# Patient Record
Sex: Male | Born: 1970 | Race: Black or African American | Hispanic: No | Marital: Single | Smoking: Former smoker
Health system: Southern US, Community
[De-identification: ages and names within clinical notes are randomized; demographics above are authoritative.]

## PROBLEM LIST (undated history)

## (undated) DIAGNOSIS — Q549 Hypospadias, unspecified: Secondary | ICD-10-CM

## (undated) DIAGNOSIS — A048 Other specified bacterial intestinal infections: Secondary | ICD-10-CM

## (undated) DIAGNOSIS — A539 Syphilis, unspecified: Secondary | ICD-10-CM

## (undated) DIAGNOSIS — E669 Obesity, unspecified: Secondary | ICD-10-CM

## (undated) DIAGNOSIS — N390 Urinary tract infection, site not specified: Secondary | ICD-10-CM

## (undated) HISTORY — DX: Other specified bacterial intestinal infections: A04.8

## (undated) HISTORY — PX: HERNIA REPAIR: SHX51

## (undated) HISTORY — DX: Urinary tract infection, site not specified: N39.0

## (undated) HISTORY — DX: Syphilis, unspecified: A53.9

## (undated) HISTORY — DX: Obesity, unspecified: E66.9

## (undated) HISTORY — DX: Hypospadias, unspecified: Q54.9

---

## 1998-01-15 ENCOUNTER — Emergency Department (HOSPITAL_COMMUNITY): Admission: EM | Admit: 1998-01-15 | Discharge: 1998-01-15 | Payer: Self-pay | Admitting: Emergency Medicine

## 1998-01-22 ENCOUNTER — Emergency Department (HOSPITAL_COMMUNITY): Admission: EM | Admit: 1998-01-22 | Discharge: 1998-01-22 | Payer: Self-pay | Admitting: Emergency Medicine

## 2005-03-09 ENCOUNTER — Emergency Department (HOSPITAL_COMMUNITY): Admission: EM | Admit: 2005-03-09 | Discharge: 2005-03-10 | Payer: Self-pay | Admitting: Emergency Medicine

## 2005-12-18 ENCOUNTER — Emergency Department (HOSPITAL_COMMUNITY): Admission: EM | Admit: 2005-12-18 | Discharge: 2005-12-18 | Payer: Self-pay | Admitting: Emergency Medicine

## 2006-01-09 ENCOUNTER — Emergency Department (HOSPITAL_COMMUNITY): Admission: EM | Admit: 2006-01-09 | Discharge: 2006-01-09 | Payer: Self-pay | Admitting: Family Medicine

## 2006-05-31 ENCOUNTER — Emergency Department (HOSPITAL_COMMUNITY): Admission: EM | Admit: 2006-05-31 | Discharge: 2006-05-31 | Payer: Self-pay | Admitting: Emergency Medicine

## 2006-11-24 ENCOUNTER — Emergency Department (HOSPITAL_COMMUNITY): Admission: EM | Admit: 2006-11-24 | Discharge: 2006-11-24 | Payer: Self-pay | Admitting: Emergency Medicine

## 2007-02-24 ENCOUNTER — Emergency Department (HOSPITAL_COMMUNITY): Admission: EM | Admit: 2007-02-24 | Discharge: 2007-02-24 | Payer: Self-pay | Admitting: Family Medicine

## 2007-06-16 ENCOUNTER — Emergency Department (HOSPITAL_COMMUNITY): Admission: EM | Admit: 2007-06-16 | Discharge: 2007-06-16 | Payer: Self-pay | Admitting: Emergency Medicine

## 2008-03-17 ENCOUNTER — Emergency Department (HOSPITAL_COMMUNITY): Admission: EM | Admit: 2008-03-17 | Discharge: 2008-03-17 | Payer: Self-pay | Admitting: Emergency Medicine

## 2010-04-16 ENCOUNTER — Emergency Department (HOSPITAL_COMMUNITY)
Admission: EM | Admit: 2010-04-16 | Discharge: 2010-04-16 | Payer: Self-pay | Source: Home / Self Care | Admitting: Emergency Medicine

## 2010-04-19 ENCOUNTER — Emergency Department (HOSPITAL_COMMUNITY)
Admission: EM | Admit: 2010-04-19 | Discharge: 2010-04-19 | Disposition: A | Discharge status: Still In Hospital | Payer: Self-pay | Source: Home / Self Care | Admitting: Emergency Medicine

## 2010-07-04 LAB — DIFFERENTIAL
Basophils Absolute: 0 10*3/uL (ref 0.0–0.1)
Eosinophils Absolute: 0 10*3/uL (ref 0.0–0.7)
Lymphs Abs: 2.5 10*3/uL (ref 0.7–4.0)
Monocytes Relative: 9 % (ref 3–12)

## 2010-07-04 LAB — URINALYSIS, ROUTINE W REFLEX MICROSCOPIC
Bilirubin Urine: NEGATIVE
Glucose, UA: NEGATIVE mg/dL
Ketones, ur: NEGATIVE mg/dL
Protein, ur: NEGATIVE mg/dL

## 2010-07-04 LAB — URINE MICROSCOPIC-ADD ON

## 2010-07-04 LAB — CBC
Hemoglobin: 14.3 g/dL (ref 13.0–17.0)
MCH: 30 pg (ref 26.0–34.0)
MCV: 86.2 fL (ref 78.0–100.0)
RBC: 4.77 MIL/uL (ref 4.22–5.81)

## 2010-07-04 LAB — COMPREHENSIVE METABOLIC PANEL
AST: 32 U/L (ref 0–37)
BUN: 7 mg/dL (ref 6–23)
CO2: 28 mEq/L (ref 19–32)
Chloride: 103 mEq/L (ref 96–112)
Creatinine, Ser: 0.9 mg/dL (ref 0.4–1.5)
GFR calc Af Amer: >60 mL/min (ref >60)
GFR calc non Af Amer: >60 mL/min (ref >60)
Total Bilirubin: 0.8 mg/dL (ref 0.3–1.2)

## 2011-01-24 LAB — GC/CHLAMYDIA PROBE AMP, GENITAL
Chlamydia, DNA Probe: NEGATIVE
GC Probe Amp, Genital: NEGATIVE

## 2011-01-24 LAB — RPR: RPR Ser Ql: REACTIVE — AB

## 2012-04-25 ENCOUNTER — Encounter (HOSPITAL_COMMUNITY): Payer: Self-pay | Admitting: Emergency Medicine

## 2012-04-25 ENCOUNTER — Other Ambulatory Visit (HOSPITAL_COMMUNITY)
Admission: RE | Admit: 2012-04-25 | Discharge: 2012-04-25 | Disposition: A | Discharge status: Home / Self Care | Payer: Self-pay | Source: Ambulatory Visit | Attending: Emergency Medicine | Admitting: Emergency Medicine

## 2012-04-25 ENCOUNTER — Emergency Department (INDEPENDENT_AMBULATORY_CARE_PROVIDER_SITE_OTHER)
Admission: EM | Admit: 2012-04-25 | Discharge: 2012-04-25 | Disposition: A | Discharge status: Home / Self Care | Payer: Self-pay | Source: Home / Self Care | Attending: Emergency Medicine | Admitting: Emergency Medicine

## 2012-04-25 DIAGNOSIS — Z113 Encounter for screening for infections with a predominantly sexual mode of transmission: Secondary | ICD-10-CM | POA: Insufficient documentation

## 2012-04-25 DIAGNOSIS — K2970 Gastritis, unspecified, without bleeding: Secondary | ICD-10-CM

## 2012-04-25 DIAGNOSIS — N390 Urinary tract infection, site not specified: Secondary | ICD-10-CM

## 2012-04-25 DIAGNOSIS — A048 Other specified bacterial intestinal infections: Secondary | ICD-10-CM

## 2012-04-25 LAB — POCT URINALYSIS DIP (DEVICE)
Protein, ur: NEGATIVE mg/dL
Urobilinogen, UA: 4 mg/dL — ABNORMAL HIGH (ref 0.0–1.0)
pH: 7 (ref 5.0–8.0)

## 2012-04-25 MED ORDER — GI COCKTAIL ~~LOC~~
ORAL | Status: AC
  Filled 2012-04-25: qty 30

## 2012-04-25 MED ORDER — METRONIDAZOLE 500 MG PO TABS: 500.0000 mg | ORAL_TABLET | Freq: Two times a day (BID) | ORAL | Status: DC

## 2012-04-25 MED ORDER — OMEPRAZOLE 20 MG PO CPDR: 20.0000 mg | DELAYED_RELEASE_CAPSULE | Freq: Every day | ORAL | Status: DC

## 2012-04-25 MED ORDER — GI COCKTAIL ~~LOC~~
30.0000 mL | Freq: Once | ORAL | Status: AC
  Administered 2012-04-25: 30 mL via ORAL

## 2012-04-25 MED ORDER — AMOXICILLIN 500 MG PO CAPS: 1000.0000 mg | ORAL_CAPSULE | Freq: Two times a day (BID) | ORAL | Status: DC

## 2012-04-25 NOTE — ED Notes (Signed)
Pt reports burning and stomach pain after eating and some urinary discomfort

## 2012-04-25 NOTE — ED Provider Notes (Signed)
Chief Complaint  Patient presents with  . Abdominal Pain    History of Present Illness:    The patient is a 42 year old male who presents today with a two-day history of burning epigastric pain after he is or drinks anything. He's felt nauseated, doesn't have much of an appetite, thinks he may have lost weight. He denies any vomiting, constipation, diarrhea, or melena. He's had no fever or chills.  He also has a 29 day history of dysuria and frequency. He denies any urethral discharge. He does have a history of hypospadias. He also has a history of urinary tract infections and a history of syphilis. He denies any gonorrhea or chlamydia. He denies any urethral discharge. No lesions on the penis.  Review of Systems:  Other than noted above, the patient denies any of the following symptoms: Constitutional:  No fever, chills, fatigue, weight loss or anorexia. Lungs:  No cough or shortness of breath. Heart:  No chest pain, palpitations, syncope or edema.  No cardiac history. Abdomen:  No nausea, vomiting, hematememesis, melena, diarrhea, or hematochezia. GU:  No dysuria, frequency, urgency, or hematuria.  No testicular pain or swelling.  PMFSH:  Past medical history, family history, social history, meds, and allergies were reviewed along with nurse's notes.  No prior abdominal surgeries or history of GI problems.  No use of NSAIDs or aspirin.  No excessive  alcohol intake.  Physical Exam:   Vital signs:  BP 117/69  Pulse 60  Temp 98.2 F (36.8 C) (Oral)  Resp 16  SpO2 100% Gen:  Alert, oriented, in no distress. Lungs:  Breath sounds clear and equal bilaterally.  No wheezes, rales or rhonchi. Heart:  Regular rhythm.  No gallops or murmers.   Abdomen:  Abdomen is soft, flat, nondistended. There is slight epigastric tenderness to palpation without guarding or rebound. No organomegaly or mass. Bowel sounds are normally active. Murphy sign and Murphy's punch were negative. Genital exam: He has  hypospadias. This has not been repaired. There's no urethral discharge. No lesions on the penis, testes are normal, no hernia, no inguinal lymphadenopathy. Skin:  Clear, warm and dry.  No rash.  Labs:   Results for orders placed during the hospital encounter of 04/25/12  POCT H PYLORI SCREEN      Component Value Range   H. PYLORI SCREEN, POC POSITIVE (*) NEGATIVE  POCT URINALYSIS DIP (DEVICE)      Component Value Range   Glucose, UA NEGATIVE  NEGATIVE mg/dL   Bilirubin Urine NEGATIVE  NEGATIVE   Ketones, ur NEGATIVE  NEGATIVE mg/dL   Specific Gravity, Urine 1.010  1.005 - 1.030   Hgb urine dipstick TRACE (*) NEGATIVE   pH 7.0  5.0 - 8.0   Protein, ur NEGATIVE  NEGATIVE mg/dL   Urobilinogen, UA 4.0 (*) 0.0 - 1.0 mg/dL   Nitrite NEGATIVE  NEGATIVE   Leukocytes, UA TRACE (*) NEGATIVE    Other Labs Obtained at Urgent Care Center:  He a first for a specimen was obtained for DNA probe for gonorrhea, Chlamydia, and Trichomonas. Also obtained a clean voided specimen for urine culture.  Results are pending at this time and we will call about any positive results.  Assessment:  The primary encounter diagnosis was Helicobacter pylori gastritis. A diagnosis of UTI (lower urinary tract infection) was also pertinent to this visit.  I will go ahead and treat H. pylori infection have him followup with a gastroenterologist in 2 weeks. I'm going to await the results of  the urine culture before treating him for UTI, since the medications that he is on may be sufficient.  Plan:   1.  The following meds were prescribed:   New Prescriptions   AMOXICILLIN (AMOXIL) 500 MG CAPSULE    Take 2 capsules (1,000 mg total) by mouth 2 (two) times daily.   METRONIDAZOLE (FLAGYL) 500 MG TABLET    Take 1 tablet (500 mg total) by mouth 2 (two) times daily.   OMEPRAZOLE (PRILOSEC) 20 MG CAPSULE    Take 1 capsule (20 mg total) by mouth daily.   He is also to take Pepto-Bismol 2 tablespoons full 4 times daily with meals  and at bedtime. He should continue the omeprazole for 2 months.  2.  The patient was instructed in symptomatic care and handouts were given. 3.  The patient was told to return if becoming worse in any way, if no better in 3 or 4 days, and given some red flag symptoms that would indicate earlier return.  Follow up:  The patient was told to follow up with Dr. Julio Alm in 2 weeks.      Reuben Likes, MD 04/25/12 2006

## 2012-04-26 ENCOUNTER — Encounter: Payer: Self-pay | Admitting: Internal Medicine

## 2012-04-27 LAB — URINE CULTURE
Colony Count: 30000
Special Requests: NORMAL

## 2012-05-02 ENCOUNTER — Encounter: Payer: Self-pay | Admitting: *Deleted

## 2012-05-02 NOTE — ED Notes (Signed)
Urine culture: 30,000 colonies E. Coli.  Pt. adequately treated with Amoxicillin. Vassie Moselle 05/02/2012

## 2012-05-16 ENCOUNTER — Telehealth (HOSPITAL_COMMUNITY): Payer: Self-pay | Admitting: *Deleted

## 2012-05-16 NOTE — ED Notes (Signed)
Affirm: Trich pos.  Pt. adequately treated with Flagyl. I called cell/home number and contact number and got a fast busy signal for all of them.  I called and left a message at pt.'s work number.

## 2012-05-23 ENCOUNTER — Telehealth (HOSPITAL_COMMUNITY): Payer: Self-pay | Admitting: *Deleted

## 2012-05-23 NOTE — ED Notes (Signed)
I called pt.  Pt. verified x 2 and given results.  Pt. told he was adequately treated for UTI with Amoxicillin and Trich with Flagyl  Instructed to notify hisr partner to be treated with Flagyl, no sex until you have finished your medication and your partner has been treated and to practice safe sex. You can get HIV testing at the [facility] clinic by appointment.  Pt. said he had 2 pills left. I told him he should have finished it on 1/16.  He said he will finish it. Vassie Moselle 05/23/2012

## 2012-06-04 ENCOUNTER — Ambulatory Visit: Payer: Self-pay | Admitting: Internal Medicine

## 2012-06-24 ENCOUNTER — Encounter (HOSPITAL_COMMUNITY): Payer: Self-pay | Admitting: *Deleted

## 2012-06-24 ENCOUNTER — Emergency Department (HOSPITAL_COMMUNITY)
Admission: EM | Admit: 2012-06-24 | Discharge: 2012-06-24 | Disposition: A | Discharge status: Home / Self Care | Payer: Self-pay | Source: Home / Self Care | Attending: Family Medicine | Admitting: Family Medicine

## 2012-06-24 DIAGNOSIS — Z202 Contact with and (suspected) exposure to infections with a predominantly sexual mode of transmission: Secondary | ICD-10-CM

## 2012-06-24 MED ORDER — METRONIDAZOLE 500 MG PO TABS: 500.0000 mg | ORAL_TABLET | Freq: Two times a day (BID) | ORAL | Status: DC

## 2012-06-24 NOTE — ED Provider Notes (Signed)
History     CSN: [id number]  Arrival date & time 06/24/12  1509   First MD Initiated Contact with Patient 06/24/12 1553      Chief Complaint  Patient presents with  . Exposure to STD    (Consider location/radiation/quality/duration/timing/severity/associated sxs/prior treatment) Patient is a 42 y.o. male presenting with STD exposure. The history is provided by the patient.  Exposure to STD This is a new problem. The current episode started 3 to 5 hours ago (told today at hosp that girlfriend with trichomonas, he wants rx also.). The problem has not changed since onset.   Past Medical History  Diagnosis Date  . Hypospadias   . UTI (lower urinary tract infection)   . Syphilis   . H. pylori infection     History reviewed. No pertinent past surgical history.  History reviewed. No pertinent family history.  History  Substance Use Topics  . Smoking status: Current Every Day Smoker  . Smokeless tobacco: Not on file  . Alcohol Use: Yes      Review of Systems  Constitutional: Negative.   Genitourinary: Negative.     Allergies  Septra  Home Medications   Current Outpatient Rx  Name  Route  Sig  Dispense  Refill  . amoxicillin (AMOXIL) 500 MG capsule   Oral   Take 2 capsules (1,000 mg total) by mouth 2 (two) times daily.   56 capsule   0     Dispense as written.   . metroNIDAZOLE (FLAGYL) 500 MG tablet   Oral   Take 1 tablet (500 mg total) by mouth 2 (two) times daily.   28 tablet   0   . metroNIDAZOLE (FLAGYL) 500 MG tablet   Oral   Take 1 tablet (500 mg total) by mouth 2 (two) times daily.   14 tablet   0   . omeprazole (PRILOSEC) 20 MG capsule   Oral   Take 1 capsule (20 mg total) by mouth daily.   30 capsule   1     BP 135/85  Pulse 65  Temp(Src) 98.9 F (37.2 C) (Oral)  Resp 16  Physical Exam  Nursing note and vitals reviewed. Constitutional: He is oriented to person, place, and time. He appears well-developed and well-nourished.   Genitourinary: Penis normal.  Neurological: He is alert and oriented to person, place, and time.  Skin: Skin is warm and dry.    ED Course  Procedures (including critical care time)  Labs Reviewed - No data to display No results found.   1. Trichomonas exposure       MDM          Linna Hoff, MD 06/24/12 [phone]

## 2012-06-24 NOTE — ED Notes (Signed)
Pt   Reports  He   Was  Exposed  To  Poss  Std   He  denys  Any  Symptoms  At this  Time

## 2012-07-18 ENCOUNTER — Telehealth: Payer: Self-pay | Admitting: Internal Medicine

## 2012-07-18 NOTE — Telephone Encounter (Signed)
Message copied by Arna Snipe on Thu Jul 18, 2012  2:49 PM ------      Message from: Richardson Chiquito      Created: Wed Jun 05, 2012  7:57 AM                   ----- Message -----         From: Hart Carwin, MD         Sent: 06/04/2012  10:40 PM           To: Richardson Chiquito, CMA            Please charge no show      ----- Message -----         From: Richardson Chiquito, CMA         Sent: 06/04/2012   3:39 PM           To: Hart Carwin, MD            Patient no showed for appointment with Dr Juanda Chance on 06/04/12. Dr Juanda Chance, do you want to charge no show fee?       ------

## 2012-08-01 ENCOUNTER — Emergency Department (INDEPENDENT_AMBULATORY_CARE_PROVIDER_SITE_OTHER)
Admission: EM | Admit: 2012-08-01 | Discharge: 2012-08-01 | Disposition: A | Discharge status: Home / Self Care | Payer: Self-pay | Source: Home / Self Care

## 2012-08-01 DIAGNOSIS — A048 Other specified bacterial intestinal infections: Secondary | ICD-10-CM

## 2012-08-01 DIAGNOSIS — B9681 Helicobacter pylori [H. pylori] as the cause of diseases classified elsewhere: Secondary | ICD-10-CM

## 2012-08-01 DIAGNOSIS — L74519 Primary focal hyperhidrosis, unspecified: Secondary | ICD-10-CM

## 2012-08-01 DIAGNOSIS — N390 Urinary tract infection, site not specified: Secondary | ICD-10-CM

## 2012-08-01 DIAGNOSIS — L74512 Primary focal hyperhidrosis, palms: Secondary | ICD-10-CM

## 2012-08-01 LAB — POCT URINALYSIS DIP (DEVICE)
Bilirubin Urine: NEGATIVE
Glucose, UA: NEGATIVE mg/dL
Nitrite: NEGATIVE
pH: 8.5 — ABNORMAL HIGH (ref 5.0–8.0)

## 2012-08-01 MED ORDER — OMEPRAZOLE 40 MG PO CPDR: 40.0000 mg | DELAYED_RELEASE_CAPSULE | Freq: Every day | ORAL | Status: DC

## 2012-08-01 MED ORDER — CEPHALEXIN 500 MG PO CAPS: 500.0000 mg | ORAL_CAPSULE | Freq: Four times a day (QID) | ORAL | Status: DC

## 2012-08-01 NOTE — ED Provider Notes (Signed)
Medical screening examination/treatment/procedure(s) were performed by non-physician practitioner and as supervising physician I was immediately available for consultation/collaboration.  Leslee Home, M.D.  Reuben Likes, MD 08/01/12 2212

## 2012-08-01 NOTE — ED Provider Notes (Signed)
History     CSN: [id number]  Arrival date & time 08/01/12  1031   None     Chief Complaint  Patient presents with  . Anxiety    (Consider location/radiation/quality/duration/timing/severity/associated sxs/prior treatment) HPI Comments: 42 year old male having difficulty presenting chief complaint and history using street slang  rather than descriptive [place] terminology,  "Do you feel me man, Know what I'm sayin? You got me man?" Repeats the same thing over and over. He states something is wrong but unable to express what he is talking about. The only specifics are that his hands and feet sweat. He was recently treated for H. pylori. And states that he took all of his medicines. Apparently he is still having some abdominal discomfort. He also insist we obtained a urine specimen just to "check his body". Is also requesting "a blood test to see what everything is that is wrong with him." The majority of his responses to direct questioning is "I do not know man I do not know", as a result we are not obtaining specific information from a review of systems standpoint. Has a history of cannabis use.    Past Medical History  Diagnosis Date  . Hypospadias   . UTI (lower urinary tract infection)   . Syphilis   . H. pylori infection     No past surgical history on file.  No family history on file.  History  Substance Use Topics  . Smoking status: Current Every Day Smoker  . Smokeless tobacco: Not on file  . Alcohol Use: Yes      Review of Systems  Constitutional: Negative for fever, activity change and appetite change.  Respiratory: Negative.   Genitourinary: Negative.   Neurological: Negative for speech difficulty.    Allergies  Septra  Home Medications   Current Outpatient Rx  Name  Route  Sig  Dispense  Refill  . amoxicillin (AMOXIL) 500 MG capsule   Oral   Take 2 capsules (1,000 mg total) by mouth 2 (two) times daily.   56 capsule   0     Dispense as  written.   . cephALEXin (KEFLEX) 500 MG capsule   Oral   Take 1 capsule (500 mg total) by mouth 4 (four) times daily. X 7 days   28 capsule   0   . metroNIDAZOLE (FLAGYL) 500 MG tablet   Oral   Take 1 tablet (500 mg total) by mouth 2 (two) times daily.   28 tablet   0   . metroNIDAZOLE (FLAGYL) 500 MG tablet   Oral   Take 1 tablet (500 mg total) by mouth 2 (two) times daily.   14 tablet   0   . omeprazole (PRILOSEC) 20 MG capsule   Oral   Take 1 capsule (20 mg total) by mouth daily.   30 capsule   1   . omeprazole (PRILOSEC) 40 MG capsule   Oral   Take 1 capsule (40 mg total) by mouth daily.   30 capsule   0     BP 125/95  Pulse 48  Temp(Src) 98.3 F (36.8 C) (Oral)  Resp 16  SpO2 100%  Physical Exam  Nursing note and vitals reviewed. Constitutional: He appears well-developed and well-nourished. No distress.  Neck: Normal range of motion. Neck supple.  Cardiovascular: Normal rate, regular rhythm and normal heart sounds.   Pulmonary/Chest: Effort normal and breath sounds normal. No respiratory distress.  Abdominal: Soft. He exhibits no distension and no mass. There is  no tenderness. There is no rebound and no guarding.  Musculoskeletal: He exhibits no edema.  Neurological: He is alert. He exhibits normal muscle tone.  Skin: Skin is warm and dry.  Psychiatric:  Very poor fund of general and medical knowledge. Poor thinking and reasoning skills.    ED Course  Procedures (including critical care time)  Labs Reviewed  POCT URINALYSIS DIP (DEVICE) - Abnormal; Notable for the following:    Hgb urine dipstick TRACE (*)    pH 8.5 (*)    Leukocytes, UA SMALL (*)    All other components within normal limits   No results found.   1. Helicobacter positive gastritis   2. Hyperhidrosis of hands   3. UTI (lower urinary tract infection)       MDM  Keflex 500 mg 4 times a day for 7 days Omeprazole 40 mg daily for one month. Followup with her primary care  doctor that was given to you approximately one week ago. May also need followup with gastroenterologist at the same practice. Recheck promptly for any symptoms problems or worsening For her other complaints she must followup with her primary care doctor as above.        Hayden Rasmussen, NP 08/01/12 [phone]

## 2012-08-01 NOTE — ED Notes (Signed)
Pt reports " my hands are sweaty, my body feels funny, i just need you to run some tests or something, take some blood, do some of those test or something, I just ain't right you feel me" pt appears to be in no obvious distress

## 2014-11-27 ENCOUNTER — Emergency Department (HOSPITAL_COMMUNITY): Payer: 59

## 2014-11-27 ENCOUNTER — Encounter (HOSPITAL_COMMUNITY): Payer: Self-pay | Admitting: Emergency Medicine

## 2014-11-27 ENCOUNTER — Emergency Department (HOSPITAL_COMMUNITY)
Admission: EM | Admit: 2014-11-27 | Discharge: 2014-11-28 | Disposition: A | Discharge status: Home / Self Care | Payer: 59 | Attending: Emergency Medicine | Admitting: Emergency Medicine

## 2014-11-27 DIAGNOSIS — R001 Bradycardia, unspecified: Secondary | ICD-10-CM | POA: Insufficient documentation

## 2014-11-27 DIAGNOSIS — Z8619 Personal history of other infectious and parasitic diseases: Secondary | ICD-10-CM | POA: Diagnosis not present

## 2014-11-27 DIAGNOSIS — R0989 Other specified symptoms and signs involving the circulatory and respiratory systems: Secondary | ICD-10-CM | POA: Diagnosis present

## 2014-11-27 DIAGNOSIS — Z87440 Personal history of urinary (tract) infections: Secondary | ICD-10-CM | POA: Insufficient documentation

## 2014-11-27 DIAGNOSIS — Z720 Tobacco use: Secondary | ICD-10-CM | POA: Insufficient documentation

## 2014-11-27 DIAGNOSIS — F458 Other somatoform disorders: Secondary | ICD-10-CM | POA: Insufficient documentation

## 2014-11-27 MED ORDER — RANITIDINE HCL 150 MG PO CAPS: 150.0000 mg | ORAL_CAPSULE | Freq: Every day | ORAL | Status: DC

## 2014-11-27 MED ORDER — OMEPRAZOLE 20 MG PO CPDR: 20.0000 mg | DELAYED_RELEASE_CAPSULE | Freq: Two times a day (BID) | ORAL | Status: DC

## 2014-11-27 MED ORDER — GI COCKTAIL ~~LOC~~
30.0000 mL | Freq: Once | ORAL | Status: AC
  Administered 2014-11-27: 30 mL via ORAL
  Filled 2014-11-27: qty 30

## 2014-11-27 NOTE — ED Notes (Signed)
Patient returned from X-ray 

## 2014-11-27 NOTE — Discharge Instructions (Signed)
Dysphagia Swallowing problems (dysphagia) occur when solids and liquids seem to stick in your throat on the way down to your stomach, or the food takes longer to get to the stomach. Other symptoms include regurgitating food, noises coming from the throat, chest discomfort with swallowing, and a feeling of fullness or the feeling of something being stuck in your throat when swallowing. When blockage in your throat is complete, it may be associated with drooling. CAUSES  Problems with swallowing may occur because of problems with the muscles. The food cannot be propelled in the usual manner into your stomach. You may have ulcers, scar tissue, or inflammation in the tube down which food travels from your mouth to your stomach (esophagus), which blocks food from passing normally into the stomach. Causes of inflammation include:  Acid reflux from your stomach into your esophagus.  Infection.  Radiation treatment for cancer.  Medicines taken without enough fluids to wash them down into your stomach. You may have nerve problems that prevent signals from being sent to the muscles of your esophagus to contract and move your food down to your stomach. Globus pharyngeus is a relatively common problem in which there is a sense of an obstruction or difficulty in swallowing, without any physical abnormalities of the swallowing passages being found. This problem usually improves over time with reassurance and testing to rule out other causes. DIAGNOSIS Dysphagia can be diagnosed and its cause can be determined by tests in which you swallow a white substance that helps illuminate the inside of your throat (contrast medium) while X-rays are taken. Sometimes a flexible telescope that is inserted down your throat (endoscopy) to look at your esophagus and stomach is used. TREATMENT   If the dysphagia is caused by acid reflux or infection, medicines may be used.  If the dysphagia is caused by problems with your  swallowing muscles, swallowing therapy may be used to help you strengthen your swallowing muscles.  If the dysphagia is caused by a blockage or mass, procedures to remove the blockage may be done. HOME CARE INSTRUCTIONS  Try to eat soft food that is easier to swallow and check your weight on a daily basis to be sure that it is not decreasing.  Be sure to drink liquids when sitting upright (not lying down). SEEK MEDICAL CARE IF:  You are losing weight because you are unable to swallow.  You are coughing when you drink liquids (aspiration).  You are coughing up partially digested food. SEEK IMMEDIATE MEDICAL CARE IF:  You are unable to swallow your own saliva .  You are having shortness of breath or a fever, or both.  You have a hoarse voice along with difficulty swallowing. MAKE SURE YOU:  Understand these instructions.  Will watch your condition.  Will get help right away if you are not doing well or get worse. Document Released: 04/07/2000 Document Revised: 08/25/2013 Document Reviewed: 09/27/2012 [facility] Patient Information 2015 [place], [place]. This information is not intended to replace advice given to you by your health care provider. Make sure you discuss any questions you have with your health care provider.  Food Choices for Gastroesophageal Reflux Disease When you have gastroesophageal reflux disease (GERD), the foods you eat and your eating habits are very important. Choosing the right foods can help ease the discomfort of GERD. WHAT GENERAL GUIDELINES DO I NEED TO FOLLOW?  Choose fruits, vegetables, whole grains, low-fat dairy products, and low-fat meat, fish, and poultry.  Limit fats such as oils, salad  dressings, butter, nuts, and avocado.  Keep a food diary to identify foods that cause symptoms.  Avoid foods that cause reflux. These may be different for different people.  Eat frequent small meals instead of three large meals each day.  Eat your meals  slowly, in a relaxed setting.  Limit fried foods.  Cook foods using methods other than frying.  Avoid drinking alcohol.  Avoid drinking large amounts of liquids with your meals.  Avoid bending over or lying down until 2-3 hours after eating. WHAT FOODS ARE NOT RECOMMENDED? The following are some foods and drinks that may worsen your symptoms: Vegetables Tomatoes. Tomato juice. Tomato and spaghetti sauce. Chili peppers. Onion and garlic. Horseradish. Fruits Oranges, grapefruit, and lemon (fruit and juice). Meats High-fat meats, fish, and poultry. This includes hot dogs, ribs, ham, sausage, salami, and bacon. Dairy Whole milk and chocolate milk. Sour cream. Cream. Butter. Ice cream. Cream cheese.  Beverages Coffee and tea, with or without caffeine. Carbonated beverages or energy drinks. Condiments Hot sauce. Barbecue sauce.  Sweets/Desserts Chocolate and cocoa. Donuts. Peppermint and spearmint. Fats and Oils High-fat foods, including [place] fries and potato chips. Other Vinegar. Strong spices, such as black pepper, white pepper, red pepper, cayenne, curry powder, cloves, ginger, and chili powder. The items listed above may not be a complete list of foods and beverages to avoid. Contact your dietitian for more information. Document Released: 04/10/2005 Document Revised: 04/15/2013 Document Reviewed: 02/12/2013 [facility] Patient Information 2015 [place], [place]. This information is not intended to replace advice given to you by your health care provider. Make sure you discuss any questions you have with your health care provider.

## 2014-11-27 NOTE — ED Notes (Signed)
Patient approached nurse first asking about the wait time. Stated his condition was getting worse. Reevaluation of his pulse ox showed o2 sats to be 100%

## 2014-11-27 NOTE — ED Provider Notes (Signed)
CSN: [id number]     Arrival date & time 11/27/14  1838 History   First MD Initiated Contact with Patient 11/27/14 2110     Chief Complaint  Patient presents with  . Foreign Body     (Consider location/radiation/quality/duration/timing/severity/associated sxs/prior Treatment) HPI   44 year old male who presents with complaint of foreign body sensation in throat. Patient reports about 4 hours ago he was eating a peanut butter and jelly sandwich when he felt that it stuck in the back of his throat. This sensation has been persistent since. He felt that his condition is getting worse. He felt that he is having difficulty breathing, worsening when he lays down and also having taste of peanut butter when he burped. He has tried to drink water to help flush it down but sensation remains. He felt nauseous without vomiting. ED denies any significant cough, lightheadedness dizziness or severe chest pain. He has prior history of H. pylori infection and GERD but currently not taking any medication. He is a smoker. He denies any increasing stress. He denies any injury. He denies alcohol abuse. No complaint of drooling or jaw pain.    Past Medical History  Diagnosis Date  . Hypospadias   . UTI (lower urinary tract infection)   . Syphilis   . H. pylori infection    History reviewed. No pertinent past surgical history. History reviewed. No pertinent family history. History  Substance Use Topics  . Smoking status: Current Every Day Smoker  . Smokeless tobacco: Not on file  . Alcohol Use: Yes    Review of Systems  All other systems reviewed and are negative.     Allergies  Septra  Home Medications   Prior to Admission medications   Not on File   BP 125/80 mmHg  Pulse 62  Temp(Src) 98.5 F (36.9 C) (Oral)  Resp 18  SpO2 100% Physical Exam  Constitutional: He appears well-developed and well-nourished. No distress.  HENT:  Head: Atraumatic.  Mouth/Throat: Oropharynx is clear and  moist. No oropharyngeal exudate.  Eyes: Conjunctivae are normal.  Neck: Normal range of motion. Neck supple. No tracheal deviation present. No thyromegaly present.  Cardiovascular:  Bradycardia without murmurs rubs or gallops  Pulmonary/Chest: Effort normal and breath sounds normal. No stridor.  Abdominal: Soft. Bowel sounds are normal. He exhibits no distension. There is no tenderness.  Lymphadenopathy:    He has no cervical adenopathy.  Neurological: He is alert.  Skin: No rash noted.  Psychiatric: He has a normal mood and affect.  Nursing note and vitals reviewed.   ED Course  Procedures (including critical care time)  9:55 PM Patient presents with globus sensation after eating a peanut butter and jelly sandwich.  He has a normal throat exam and in no acute respiratory distress. He is not drooling and does not presents with active symptoms of dysphagia. Plan to provide GI cocktail, obtained neck soft tissue x-ray.  Care discussed with Dr. Manus Gunning.   10:15 PM Pt also report injuring his R thumb today when he was running, fell and injured his R thumb.  Will obtain xray.    11:36 PM Normal neck soft tissue xray and normal xray of R hand.  Recommend RICE therapy for R hand as it is likely a sprain. I also discussed diet for GERD.  I suspect pt has EUS related dysphagia 2/2 GERD.  Recommend f/u with GI for further care.  PPI and H2 blocker prescribed.  Return precaution given.     Labs  Review Labs Reviewed - No data to display  Imaging Review Dg Neck Soft Tissue  11/27/2014   CLINICAL DATA:  Dysphagia.  Lump in the front of the throat.  EXAM: NECK SOFT TISSUES - 1+ VIEW  COMPARISON:  Cervical spine radiographs, 11/24/2006.  FINDINGS: No soft tissue swelling or mass. Normal thickness of the epiglottis. Airway is widely patent. No radiopaque foreign body.  There is straightening of the normal cervical lordosis. There is loss of disc height throughout the cervical spine with endplate  spurring from C3 through C7. Disc degenerative changes mildly increased when compared to the prior study.  IMPRESSION: 1. No soft tissue mass or swelling.  No radiopaque foreign body. 2. Degenerative changes of the cervical spine with endplate spurring. Degenerative cervical spine changes have mildly advanced when compared the prior exam.   Electronically Signed   By: Amie Portland M.D.   On: 11/27/2014 22:19   Dg Hand Complete Right  11/27/2014   CLINICAL DATA:  RIGHT hand swelling after fall today.  EXAM: RIGHT HAND - COMPLETE 3+ VIEW  COMPARISON:  None.  FINDINGS: There is no evidence of fracture or dislocation. There is no evidence of arthropathy or other focal bone abnormality. Soft tissues are unremarkable.  IMPRESSION: Negative.   Electronically Signed   By: Awilda Metro M.D.   On: 11/27/2014 22:47     EKG Interpretation None      MDM   Final diagnoses:  Globus sensation    BP 123/92 mmHg  Pulse 50  Temp(Src) 98.5 F (36.9 C) (Oral)  Resp 12  SpO2 100%  I have reviewed nursing notes and vital signs. I personally viewed the imaging tests through PACS system and agrees with radiologist's intepretation I reviewed available ER/hospitalization records through the EMR     Fayrene Helper, PA-C 11/27/14 2337  Glynn Octave, MD 11/28/14 [phone]

## 2014-11-27 NOTE — ED Notes (Signed)
Pt stable, ambulatory, states understanding of discharge instructions 

## 2014-11-27 NOTE — ED Notes (Signed)
Pt c/o feeling like had foreign body in throat after eating and peanut butter and jelly sandwich today; pt sts feels like it is stuck but has swallowed liquids since; no distress noted

## 2015-05-22 ENCOUNTER — Emergency Department (INDEPENDENT_AMBULATORY_CARE_PROVIDER_SITE_OTHER)
Admission: EM | Admit: 2015-05-22 | Discharge: 2015-05-22 | Disposition: A | Discharge status: Home / Self Care | Payer: Self-pay | Source: Home / Self Care | Attending: Emergency Medicine | Admitting: Emergency Medicine

## 2015-05-22 ENCOUNTER — Other Ambulatory Visit (HOSPITAL_COMMUNITY)
Admission: RE | Admit: 2015-05-22 | Discharge: 2015-05-22 | Disposition: A | Discharge status: Home / Self Care | Payer: Managed Care, Other (non HMO) | Source: Ambulatory Visit | Attending: Emergency Medicine | Admitting: Emergency Medicine

## 2015-05-22 DIAGNOSIS — N390 Urinary tract infection, site not specified: Secondary | ICD-10-CM

## 2015-05-22 DIAGNOSIS — N12 Tubulo-interstitial nephritis, not specified as acute or chronic: Secondary | ICD-10-CM

## 2015-05-22 DIAGNOSIS — Z113 Encounter for screening for infections with a predominantly sexual mode of transmission: Secondary | ICD-10-CM | POA: Insufficient documentation

## 2015-05-22 DIAGNOSIS — R319 Hematuria, unspecified: Secondary | ICD-10-CM

## 2015-05-22 LAB — POCT URINALYSIS DIP (DEVICE)
Bilirubin Urine: NEGATIVE
GLUCOSE, UA: NEGATIVE mg/dL
Ketones, ur: NEGATIVE mg/dL
NITRITE: POSITIVE — AB
PROTEIN: NEGATIVE mg/dL
SPECIFIC GRAVITY, URINE: 1.02 (ref 1.005–1.030)
UROBILINOGEN UA: 4 mg/dL — AB (ref 0.0–1.0)
pH: 7 (ref 5.0–8.0)

## 2015-05-22 MED ORDER — LIDOCAINE HCL (PF) 1 % IJ SOLN
INTRAMUSCULAR | Status: AC
  Filled 2015-05-22: qty 30

## 2015-05-22 MED ORDER — IBUPROFEN 800 MG PO TABS: 800.0000 mg | ORAL_TABLET | Freq: Three times a day (TID) | ORAL | Status: DC

## 2015-05-22 MED ORDER — CEFTRIAXONE SODIUM 1 G IJ SOLR
INTRAMUSCULAR | Status: AC
  Filled 2015-05-22: qty 10

## 2015-05-22 MED ORDER — CIPROFLOXACIN HCL 500 MG PO TABS: 500.0000 mg | ORAL_TABLET | Freq: Two times a day (BID) | ORAL | Status: DC

## 2015-05-22 MED ORDER — IBUPROFEN 800 MG PO TABS
ORAL_TABLET | ORAL | Status: AC
  Filled 2015-05-22: qty 1

## 2015-05-22 MED ORDER — PHENAZOPYRIDINE HCL 200 MG PO TABS: 200.0000 mg | ORAL_TABLET | Freq: Three times a day (TID) | ORAL | Status: DC | PRN

## 2015-05-22 MED ORDER — LIDOCAINE HCL (PF) 1 % IJ SOLN
INTRAMUSCULAR | Status: AC
  Filled 2015-05-22: qty 5

## 2015-05-22 MED ORDER — IBUPROFEN 800 MG PO TABS
800.0000 mg | ORAL_TABLET | Freq: Once | ORAL | Status: AC
  Administered 2015-05-22: 800 mg via ORAL

## 2015-05-22 MED ORDER — CEFTRIAXONE SODIUM 1 G IJ SOLR
1.0000 g | Freq: Once | INTRAMUSCULAR | Status: AC
  Administered 2015-05-22: 1 g via INTRAMUSCULAR

## 2015-05-22 NOTE — ED Notes (Signed)
Patient complains of headache chills fatigued Patient states his symptoms started about four hours ago

## 2015-05-22 NOTE — ED Provider Notes (Addendum)
HPI  SUBJECTIVE:  Larry Aguirre is a 45 y.o. male who presents with headache, body aches, fevers, shaking chills, nausea, fatigue, dysuria starting 4 hours ago. He reports nasal congestion. There are no aggravating or alleviating factors. He has not tried anything for this. He denies vomiting, urinary urgency, frequency, cloudy or odorous urine, hematuria. No pelvic pain. No visual changes, rash, photophobia, stiff neck. No abdominal pain, back pain. No sinus pain/pressure, ear pain, sore throat, coughing, wheezing. No sick contacts. He did not get a flu shot this year. Patient denies penile discharge, genital rash, testicular pain or swelling. He is sexually active with a male, who is asymptomatic. Past medical history of frequent UTIs secondary to hypospadias surgery, syphilis. No history of gonorrhea, chlamydia, HIV, herpes, Trichomonas, diabetes, hypertension, asthma, emphysema, COPD, prostatitis, immunocompromise    Past Medical History  Diagnosis Date  . Hypospadias   . UTI (lower urinary tract infection)   . Syphilis   . H. pylori infection     No past surgical history on file.  No family history on file.  Social History  Substance Use Topics  . Smoking status: Current Every Day Smoker  . Smokeless tobacco: Not on file  . Alcohol Use: Yes    No current facility-administered medications for this encounter.  Current outpatient prescriptions:  .  ciprofloxacin (CIPRO) 500 MG tablet, Take 1 tablet (500 mg total) by mouth 2 (two) times daily. X 7 days, Disp: 14 tablet, Rfl: 0 .  ibuprofen (ADVIL,MOTRIN) 800 MG tablet, Take 1 tablet (800 mg total) by mouth 3 (three) times daily., Disp: 30 tablet, Rfl: 0 .  omeprazole (PRILOSEC) 20 MG capsule, Take 1 capsule (20 mg total) by mouth 2 (two) times daily before a meal., Disp: 30 capsule, Rfl: 0 .  phenazopyridine (PYRIDIUM) 200 MG tablet, Take 1 tablet (200 mg total) by mouth 3 (three) times daily as needed for pain., Disp: 6  tablet, Rfl: 0 .  ranitidine (ZANTAC) 150 MG capsule, Take 1 capsule (150 mg total) by mouth daily., Disp: 30 capsule, Rfl: 0  Allergies  Allergen Reactions  . Septra [Sulfamethoxazole-Trimethoprim]      ROS  As noted in HPI.   Physical Exam  BP 145/96 mmHg  Pulse 103  Temp(Src) 102.9 F (39.4 C) (Oral)  Resp 17  SpO2 99%  Constitutional: Well developed, well nourished, no acute distress Eyes: PERRL, EOMI, conjunctiva normal bilaterally HENT: Normocephalic, atraumatic,mucus membranes moist TMs normal bilaterally. Mild nasal congestion, no sinus tenderness, normal oropharynx Lymph: No cervical lymphadenopathy Respiratory: Clear to auscultation bilaterally, no rales, no wheezing, no rhonchi Cardiovascular: Regular tachycardia no murmurs, no gallops, no rubs GI: Soft, nondistended, normal bowel sounds, no rebound, no guarding. Positive right upper flank tenderness, no right upper quadrant tenderness. Negative Murphy, negative McBurney Back: Questionable right-sided CVAT GU: Normal circumcised male, testes descended bilaterally. Penile rash, discharge. Prostate firm, nontender, not boggy. Patient declined chaperone. skin: No rash, skin intact Musculoskeletal: No edema, no tenderness, no deformities Neurologic: Alert & oriented x 3, CN II-XII grossly intact, no motor deficits, sensation grossly intact Psychiatric: Speech and behavior appropriate   ED Course   Medications  ibuprofen (ADVIL,MOTRIN) tablet 800 mg (800 mg Oral Given 05/22/15 1741)  cefTRIAXone (ROCEPHIN) injection 1 g (1 g Intramuscular Given 05/22/15 1830)    Orders Placed This Encounter  Procedures  . Urine culture    Standing Status: Standing     Number of Occurrences: 1     Standing Expiration Date:  Order Specific Question:  Patient immune status    Answer:  Normal  . POCT urinalysis dip (device)    Standing Status: Standing     Number of Occurrences: 1     Standing Expiration Date:    Results  for orders placed or performed during the hospital encounter of 05/22/15 (from the past 24 hour(s))  POCT urinalysis dip (device)     Status: Abnormal   Collection Time: 05/22/15  6:06 PM  Result Value Ref Range   Glucose, UA NEGATIVE NEGATIVE mg/dL   Bilirubin Urine NEGATIVE NEGATIVE   Ketones, ur NEGATIVE NEGATIVE mg/dL   Specific Gravity, Urine 1.020 1.005 - 1.030   Hgb urine dipstick TRACE (A) NEGATIVE   pH 7.0 5.0 - 8.0   Protein, ur NEGATIVE NEGATIVE mg/dL   Urobilinogen, UA 4.0 (H) 0.0 - 1.0 mg/dL   Nitrite POSITIVE (A) NEGATIVE   Leukocytes, UA SMALL (A) NEGATIVE   No results found.  ED Clinical Impression  Urinary tract infection with hematuria, site unspecified  Pyelonephritis   ED Assessment/Plan  We'll rule out UTI, pyelonephritis, also sending a GC, chlamydia. No evidence of prostatitis at this time.   UA consistent with UTI. Urine culture sent.  Giving 1 g Rocephin to cover pyelonephritis.  May also have an influenza-like illness, however, given the dysuria, we'll treat as complicated UTI/pyelonephritis. We will attempt outpatient treatment as patient has no other comorbidities. Will provide primary care and urology referral. Giving patient strict return precautions.  Discussed labs, MDM, plan and followup with patient. Discussed sn/sx that should prompt return to the UC or ED. Patient  agrees with plan.   *This clinic note was created using Dragon dictation software. Therefore, there may be occasional mistakes despite careful proofreading.  ?  Domenick Gong, MD 05/22/15 2142  Domenick Gong, MD 05/22/15 [phone]

## 2015-05-22 NOTE — Discharge Instructions (Signed)
Make sure you drink plenty of fluids. Take 1 g of Tylenol with 800 mg of ibuprofen 3 times a day. This is an effective combination for pain and fever. Follow-up with primary care physician of choice, follow-up with urology in a week. CR referral. Go to the emergency room if you have persistent fevers after being on the antibiotics for 48 hours, abdominal pain, pelvic pain, if you get worse, or for any concerns.  Go to www.goodrx.com to look up your medications. This will give you a list of where you can find your prescriptions at the most affordable prices.   This practice is taking new patients. They will see you even if you do not have insurance.  [facility] [address] [place], [place]  [address] [phone]  If you have no primary doctor, here are some resources that may be helpful:  Medicaid-accepting [facility] Providers: - [facility]- [address]  [phone];   - [facility]- [address] [place], [address] [phone]  - [facility]- [address] [phone]  [facility]- [address] [phone]  - Renaye Rakers- [address] [phone]. Only accepts [place] patients after they have her name applied to their card  -Dr. Greggory Stallion Osei-Bonsu, [facility]. [address]    [place], [place] [address]  [phone]  Self Pay (no insurance) in [place]: - Sickle Cell Patients: Dr Willey Blade, [facility] [address] [place] [phone]  - Health [phone]  - Physician Referral Service- [phone]  - [facility]- [address], [place], [phone];  Monday to Friday, 9 a.m. - 7 p.m.; Saturday 9 a.m. to 1 p.m.  [facility]- [address] [place], [place] [phone]  - [facility]- [address]      [phone] - [facility]- [address] [phone]  [facility], [address] [place]; [phone]; or [address], [place]; [phone].   [facility] [place], [place]. [address], [place]; [phone]; Monday to Wednesday, 8:30 a.m. - 5 p.m.; Thursday, 8:30 a.m. - 8 p.m.  [facility], [address], [place]; [phone]; Monday to Friday, 8 a.m. - 4:30 p.m.   [facility], [place] [address], [place], [phone]; first and third Saturday of the month, 9:30 a.m. - 12:30 p.m.  [facility], [address], [place], [phone]; second Saturday of the month, 9 a.m. -noon.  [facility] for children. For information, call [phone]; [phone]; or [phone].  Other agencies that provide inexpensive medical care:     [facility]  [phone]    [facility]  [phone]    [facility]  [phone] [address] [place] [address]    [facility]  [phone]    [facility]  [phone], [phone]; or [phone].  Chronic Pain Problems Contact [facility]  [phone] Patients need to be referred by their primary care doctor.  [facility] [place]     [employer]  [facility]. [address]   [phone] (After Hours)  General Information: Finding a doctor when you do not have health insurance can be tricky. Although you are not limited by an insurance plan, you are of course limited by her finances and how much but he can pay out of pocket.  What are your options if you don't have health insurance?   1) Find a Tax adviser and Pay Out of Pocket Although you won't have to find out who is covered by your insurance plan, it is a good idea to ask around  and get recommendations. You will then need to call the office and see if the doctor you have chosen will accept you as a new patient and what types of options they offer for patients who are self-pay. Some doctors offer discounts or will set up payment plans for their patients who do not have insurance, but you will need to ask so you aren't surprised when you get to your appointment.  2) Contact Your Local Health Department Not all health departments have doctors that can see patients for sick visits, but many do, so it is worth a call to see if yours does. If you don't know where your local health department is, you can check in your phone book. The CDC also has a tool to help you locate your state's health department, and many state websites also have listings of all of their local health departments.  3) Find a [facility] If your illness is not likely to be very severe or complicated, you may want to try a walk in clinic. These are popping up all over the country in pharmacies, drugstores, and shopping centers. They're usually staffed by nurse practitioners or physician assistants that have been trained to treat common illnesses and complaints. They're usually fairly quick and inexpensive. However, if you have serious medical issues or chronic medical problems, these are probably not your best option

## 2015-05-24 LAB — URINE CYTOLOGY ANCILLARY ONLY
CHLAMYDIA, DNA PROBE: POSITIVE — AB
NEISSERIA GONORRHEA: NEGATIVE

## 2015-05-24 LAB — URINE CULTURE: SPECIAL REQUESTS: NORMAL

## 2015-05-25 ENCOUNTER — Telehealth: Payer: Self-pay | Admitting: Internal Medicine

## 2015-05-25 DIAGNOSIS — A5601 Chlamydial cystitis and urethritis: Secondary | ICD-10-CM

## 2015-05-25 MED ORDER — AZITHROMYCIN 1 G PO PACK: 1.0000 g | PACK | Freq: Once | ORAL | Status: DC

## 2015-05-25 NOTE — ED Notes (Signed)
Chlamydia positive at [facility] visit 1/28.  Given injection of rocephin at [facility] visit for febrile illness, but not treated with doxy or zithromax. Will send rx for zithromax 1g po dose to [facility] (pharmacy of record).   Please notify patient and health department.  LM  Eustace Moore, MD 05/25/15 [phone]

## 2015-05-29 ENCOUNTER — Telehealth (HOSPITAL_COMMUNITY): Payer: Self-pay | Admitting: Emergency Medicine

## 2015-05-29 NOTE — ED Notes (Signed)
Called pt and notified of recent lab results from visit Pt ID'd properly... Reports feeling better Pt is Neg for Gon/Chlam.... Pos for Gardnerella  Per Dr. Dayton Scrape,  Rx zithromax single dose sent to [facility] on [address] (pharmacy of record). Please notify patient and health department of chlamydia positive.   Notes Recorded by Eustace Moore, MD on 05/24/2015 at 2:57 PM Urine culture without predominant organism, unclear dx of UTI. Patient should finish rx cipro received at [facility] visit 05/22/15, recheck if not improving. LM  Adv pt if sx are not getting better to return  Pt verb understanding.

## 2015-07-02 ENCOUNTER — Encounter (HOSPITAL_COMMUNITY): Payer: Self-pay | Admitting: Vascular Surgery

## 2015-07-02 ENCOUNTER — Emergency Department (HOSPITAL_COMMUNITY)
Admission: EM | Admit: 2015-07-02 | Discharge: 2015-07-02 | Disposition: A | Discharge status: Home / Self Care | Payer: Managed Care, Other (non HMO) | Attending: Emergency Medicine | Admitting: Emergency Medicine

## 2015-07-02 DIAGNOSIS — S4991XA Unspecified injury of right shoulder and upper arm, initial encounter: Secondary | ICD-10-CM | POA: Insufficient documentation

## 2015-07-02 DIAGNOSIS — S299XXA Unspecified injury of thorax, initial encounter: Secondary | ICD-10-CM | POA: Diagnosis not present

## 2015-07-02 DIAGNOSIS — Z79899 Other long term (current) drug therapy: Secondary | ICD-10-CM | POA: Insufficient documentation

## 2015-07-02 DIAGNOSIS — Y92410 Unspecified street and highway as the place of occurrence of the external cause: Secondary | ICD-10-CM | POA: Diagnosis not present

## 2015-07-02 DIAGNOSIS — F17200 Nicotine dependence, unspecified, uncomplicated: Secondary | ICD-10-CM | POA: Diagnosis not present

## 2015-07-02 DIAGNOSIS — Y998 Other external cause status: Secondary | ICD-10-CM | POA: Diagnosis not present

## 2015-07-02 DIAGNOSIS — Y9389 Activity, other specified: Secondary | ICD-10-CM | POA: Diagnosis not present

## 2015-07-02 DIAGNOSIS — Z8619 Personal history of other infectious and parasitic diseases: Secondary | ICD-10-CM | POA: Insufficient documentation

## 2015-07-02 DIAGNOSIS — Z87440 Personal history of urinary (tract) infections: Secondary | ICD-10-CM | POA: Diagnosis not present

## 2015-07-02 MED ORDER — NAPROXEN 500 MG PO TABS: 500.0000 mg | ORAL_TABLET | Freq: Two times a day (BID) | ORAL | Status: DC

## 2015-07-02 MED ORDER — KETOROLAC TROMETHAMINE 30 MG/ML IJ SOLN
30.0000 mg | Freq: Once | INTRAMUSCULAR | Status: AC
  Administered 2015-07-02: 30 mg via INTRAMUSCULAR
  Filled 2015-07-02: qty 1

## 2015-07-02 NOTE — ED Notes (Signed)
No signs of allergic reaction to IM toradol
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
## 2015-07-02 NOTE — ED Notes (Signed)
Ben PA at bedside. 
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
## 2015-10-01 ENCOUNTER — Encounter (HOSPITAL_COMMUNITY): Payer: Self-pay | Admitting: Emergency Medicine

## 2015-10-01 ENCOUNTER — Emergency Department (HOSPITAL_COMMUNITY): Payer: Managed Care, Other (non HMO)

## 2015-10-01 ENCOUNTER — Emergency Department (HOSPITAL_COMMUNITY)
Admission: EM | Admit: 2015-10-01 | Discharge: 2015-10-01 | Disposition: A | Discharge status: Home / Self Care | Payer: Managed Care, Other (non HMO) | Attending: Emergency Medicine | Admitting: Emergency Medicine

## 2015-10-01 DIAGNOSIS — F17200 Nicotine dependence, unspecified, uncomplicated: Secondary | ICD-10-CM | POA: Diagnosis not present

## 2015-10-01 DIAGNOSIS — W14XXXA Fall from tree, initial encounter: Secondary | ICD-10-CM | POA: Diagnosis not present

## 2015-10-01 DIAGNOSIS — Z79899 Other long term (current) drug therapy: Secondary | ICD-10-CM | POA: Insufficient documentation

## 2015-10-01 DIAGNOSIS — S060X0A Concussion without loss of consciousness, initial encounter: Secondary | ICD-10-CM | POA: Diagnosis not present

## 2015-10-01 DIAGNOSIS — S0101XA Laceration without foreign body of scalp, initial encounter: Secondary | ICD-10-CM | POA: Diagnosis not present

## 2015-10-01 DIAGNOSIS — H1131 Conjunctival hemorrhage, right eye: Secondary | ICD-10-CM | POA: Diagnosis not present

## 2015-10-01 DIAGNOSIS — S60812A Abrasion of left wrist, initial encounter: Secondary | ICD-10-CM | POA: Diagnosis not present

## 2015-10-01 DIAGNOSIS — Y92009 Unspecified place in unspecified non-institutional (private) residence as the place of occurrence of the external cause: Secondary | ICD-10-CM | POA: Diagnosis not present

## 2015-10-01 DIAGNOSIS — Y9389 Activity, other specified: Secondary | ICD-10-CM | POA: Diagnosis not present

## 2015-10-01 DIAGNOSIS — W19XXXA Unspecified fall, initial encounter: Secondary | ICD-10-CM

## 2015-10-01 DIAGNOSIS — S0011XA Contusion of right eyelid and periocular area, initial encounter: Secondary | ICD-10-CM

## 2015-10-01 DIAGNOSIS — Y999 Unspecified external cause status: Secondary | ICD-10-CM | POA: Insufficient documentation

## 2015-10-01 DIAGNOSIS — S0990XA Unspecified injury of head, initial encounter: Secondary | ICD-10-CM | POA: Diagnosis present

## 2015-10-01 MED ORDER — LIDOCAINE-EPINEPHRINE (PF) 2 %-1:200000 IJ SOLN
20.0000 mL | Freq: Once | INTRAMUSCULAR | Status: AC
  Administered 2015-10-01: 20 mL
  Filled 2015-10-01: qty 20

## 2015-10-01 MED ORDER — HYDROCODONE-ACETAMINOPHEN 5-325 MG PO TABS: ORAL_TABLET | ORAL | Status: DC

## 2015-10-01 MED ORDER — HYDROCODONE-ACETAMINOPHEN 5-325 MG PO TABS
1.0000 | ORAL_TABLET | Freq: Once | ORAL | Status: AC
Start: 2015-10-01 — End: 2015-10-01
  Administered 2015-10-01: 1 via ORAL
  Filled 2015-10-01: qty 1

## 2015-10-01 MED ORDER — LIDOCAINE-EPINEPHRINE (PF) 1 %-1:200000 IJ SOLN
20.0000 mL | Freq: Once | INTRAMUSCULAR | Status: DC
  Filled 2015-10-01: qty 20

## 2015-10-01 MED ORDER — TETANUS-DIPHTH-ACELL PERTUSSIS 5-2.5-18.5 LF-MCG/0.5 IM SUSP
0.5000 mL | Freq: Once | INTRAMUSCULAR | Status: AC
  Administered 2015-10-01: 0.5 mL via INTRAMUSCULAR
  Filled 2015-10-01: qty 0.5

## 2015-10-01 MED ORDER — LIDOCAINE-EPINEPHRINE-TETRACAINE (LET) SOLUTION
3.0000 mL | Freq: Once | NASAL | Status: AC
  Administered 2015-10-01: 3 mL via TOPICAL
  Filled 2015-10-01: qty 3

## 2015-10-01 NOTE — ED Provider Notes (Addendum)
CSN: [id number]     Arrival date & time 10/01/15  1234 History   First MD Initiated Contact with Patient 10/01/15 1349     Chief Complaint  Patient presents with  . Head Injury     (Consider location/radiation/quality/duration/timing/severity/associated sxs/prior Treatment) HPI  Blood pressure 112/62, pulse 63, temperature 99.7 F (37.6 C), temperature source Oral, resp. rate 18, SpO2 98 %.  Larry Aguirre is a 45 y.o. male complaining of left periorbital swelling with right sided laceration status post fall out of tree just prior to arrival. Patient states estimates that he was approximately 18 feet in the air in the tree trying to get a cat out of the tree, states his head hit the ground in dirt, he is not anticoagulated, there was no loss of consciousness, last tetanus shot is unknown. Patient denies nausea, vomiting. He has blurred vision in the area where the eyes swollen with no pain with eye movement or double vision. He denies numbness, weakness, neck pain, chest pain, shortness of breath, abdominal pain, difficulty moving major joints.  Past Medical History  Diagnosis Date  . Hypospadias   . UTI (lower urinary tract infection)   . Syphilis   . H. pylori infection    History reviewed. No pertinent past surgical history. No family history on file. Social History  Substance Use Topics  . Smoking status: Current Every Day Smoker  . Smokeless tobacco: None  . Alcohol Use: Yes    Review of Systems  10 systems reviewed and found to be negative, except as noted in the HPI.   Allergies  Septra  Home Medications   Prior to Admission medications   Medication Sig Start Date End Date Taking? Authorizing Provider  ibuprofen (ADVIL,MOTRIN) 200 MG tablet Take 400 mg by mouth every 6 (six) hours as needed for fever.   Yes Historical Provider, MD  omeprazole (PRILOSEC) 20 MG capsule Take 1 capsule (20 mg total) by mouth 2 (two) times daily before a meal. 11/27/14  Yes Fayrene Helper,  PA-C  azithromycin (ZITHROMAX) 1 g powder Take 1 packet (1 g total) by mouth once. 05/25/15   Eustace Moore, MD  HYDROcodone-acetaminophen (NORCO/VICODIN) 5-325 MG tablet Take 1-2 tablets by mouth every 6 hours as needed for pain and/or cough. 10/01/15   Shaterrica Territo, PA-C  ibuprofen (ADVIL,MOTRIN) 800 MG tablet Take 1 tablet (800 mg total) by mouth 3 (three) times daily. 05/22/15   Domenick Gong, MD  naproxen (NAPROSYN) 500 MG tablet Take 1 tablet (500 mg total) by mouth 2 (two) times daily. 07/02/15   Joycie Peek, PA-C  phenazopyridine (PYRIDIUM) 200 MG tablet Take 1 tablet (200 mg total) by mouth 3 (three) times daily as needed for pain. 05/22/15   Domenick Gong, MD  ranitidine (ZANTAC) 150 MG capsule Take 1 capsule (150 mg total) by mouth daily. 11/27/14   Fayrene Helper, PA-C   BP 101/55 mmHg  Pulse 55  Temp(Src) 99.7 F (37.6 C) (Oral)  Resp 18  SpO2 99% Physical Exam  Constitutional: He is oriented to person, place, and time. He appears well-developed and well-nourished. No distress.  HENT:  Head: Normocephalic.  Mouth/Throat: Oropharynx is clear and moist.  3 cm full-thickness laceration to left parietal area with bleeding controlled, does not penetrate up galea.  Right-sided periorbital ecchymoses, mild tenderness to palpation on the lateral orbital rim with no crepitance, extraocular movement is intact without pain or diplopia pupils are equal round and reactive to light + subconjunctival hemorrhage.  No  hemotympanum, battle signs or raccoon's eyes   No abnormal otorrhea or rhinorrhea. Nasal septum midline.  No intraoral trauma.  Eyes: Conjunctivae and EOM are normal. Pupils are equal, round, and reactive to light.  Neck: Normal range of motion. Neck supple.  No midline C-spine  tenderness to palpation or step-offs appreciated. Patient has full range of motion without pain.  Grip/bicep/tricep strength 5/5 bilaterally. Able to differentiate between pinprick and light  touch bilaterally     Cardiovascular: Normal rate, regular rhythm and intact distal pulses.   Pulmonary/Chest: Effort normal and breath sounds normal. No stridor. No respiratory distress. He has no wheezes. He has no rales. He exhibits no tenderness.  No TTP or crepitance  Abdominal: Soft. Bowel sounds are normal. He exhibits no distension and no mass. There is no tenderness. There is no rebound and no guarding.  Musculoskeletal: Normal range of motion. He exhibits edema and tenderness.  Pelvis stable, No TTP of greater trochanter bilaterally  No tenderness to percussion of Lumbar/Thoracic spinous processes. No step-offs. No paraspinal muscular TTP.    Left wrist with partial-thickness abrasions, diffusely tender to palpation along the dorsum, no deformity, radial pulses 2+, excellent range of motion to fingers, full range of motion to left elbow and shoulder.  Neurological: He is alert and oriented to person, place, and time.  Strength 5/5 x4 extremities   Distal sensation intact  Skin: Skin is warm.  Psychiatric: He has a normal mood and affect.  Nursing note and vitals reviewed.   ED Course  .Marland KitchenLaceration Repair Date/Time: 10/01/2015 4:40 PM Performed by: Wynetta Emery Authorized by: Wynetta Emery Consent: Verbal consent obtained. Risks and benefits: risks, benefits and alternatives were discussed Consent given by: patient Required items: required blood products, implants, devices, and special equipment available Patient identity confirmed: verbally with patient Body area: head/neck Location details: scalp Laceration length: 3 cm Foreign bodies: no foreign bodies Tendon involvement: none Nerve involvement: none Vascular damage: no Anesthesia: local infiltration   (including critical care time) Labs Review Labs Reviewed - No data to display  Imaging Review Dg Chest 2 View  10/01/2015  CLINICAL DATA:  45 year old male with a history of fall EXAM: CHEST  2 VIEW  COMPARISON:  None. FINDINGS: The heart size and mediastinal contours are within normal limits. Both lungs are clear. The visualized skeletal structures are unremarkable. IMPRESSION: Negative for acute cardiopulmonary disease. Signed, Yvone Neu. Loreta Ave, DO [facility] Radiology Electronically Signed   By: Gilmer Mor D.O.   On: 10/01/2015 15:20   Dg Wrist Complete Left  10/01/2015  CLINICAL DATA:  45 year old male with a history of fall.  Wrist pain EXAM: LEFT WRIST - COMPLETE 3+ VIEW COMPARISON:  None. FINDINGS: There is no evidence of fracture or dislocation. There is no evidence of arthropathy or other focal bone abnormality. Soft tissues are unremarkable. IMPRESSION: Negative for acute bony abnormality. Signed, Yvone Neu. Loreta Ave, DO [facility] Radiology Electronically Signed   By: Gilmer Mor D.O.   On: 10/01/2015 15:19   Ct Head Wo Contrast  10/01/2015  CLINICAL DATA:  Pain after fall. Swelling to left occiput and right eye. EXAM: CT HEAD WITHOUT CONTRAST CT MAXILLOFACIAL WITHOUT CONTRAST CT CERVICAL SPINE WITHOUT CONTRAST TECHNIQUE: Multidetector CT imaging of the head, cervical spine, and maxillofacial structures were performed using the standard protocol without intravenous contrast. Multiplanar CT image reconstructions of the cervical spine and maxillofacial structures were also generated. COMPARISON:  None. FINDINGS: CT HEAD  FINDINGS There is a small amount of fluid in both maxillary sinuses. There is opacification of multiple ethmoid air cells. Mastoid air cells and middle ears are normal. See the facial bone CT report for description bones. There is soft tissue swelling around the right eye. However, the globe is intact an the swelling is all superficial. The extracranial soft tissues are otherwise normal. No subdural, epidural, or subarachnoid hemorrhage. Ventricles and sulci are normal. The cerebellum,  brainstem, and basal cisterns are within normal limits. No mass, mass effect, or midline shift. No acute cortical ischemia or infarct. CT MAXILLOFACIAL FINDINGS Soft tissue swelling is seen around the right eye. Periapical lucencies are associated with posterior maxillary molars bilaterally. There is opacification of the bilateral ostiomeatal complexes. No bony fractures are identified. CT CERVICAL SPINE FINDINGS The patient was scanned in partial flexion with mild reversal of normal lordosis. No other malalignment. No fractures are identified. Multilevel degenerative changes are seen with anterior osteophytes at C4-5, C5-6, and C6-7. There is also calcification of the posterior longitudinal ligament at at least C4, C5, and C6. The calcification of the posterior longitudinal ligament results in canal narrowing at C4, C5, and C6. The AP diameter of the canal at C4 is 7.5 mm centrally and 5.8 mm to the right. No fractures identified. Narrowing of neural foramina is seen on the left at C3-4. IMPRESSION: 1. No acute intracranial process. Opacification of ethmoid sinuses with a small amount of fluid in the maxillary sinuses with no underlying fracture identified. 2. Soft tissue swelling around the right eye. No facial bone fractures identified. 3. Periapical lucencies around the roots of several posterior maxillary molars. 4. 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 The minimum AP diameter of the canal is 5.8 mm at C4. The narrowing of the canal raises the risk for cord injury. If there is concern, an MRI would be more sensitive. Electronically Signed   By: Gerome Samavid  Williams III M.D   On: 10/01/2015 16:09   Ct Cervical Spine Wo Contrast  10/01/2015  CLINICAL DATA:  Pain after fall. Swelling to left occiput and right eye. EXAM: CT HEAD WITHOUT CONTRAST CT MAXILLOFACIAL WITHOUT CONTRAST CT CERVICAL SPINE WITHOUT CONTRAST TECHNIQUE:  Multidetector CT imaging of the head, cervical spine, and maxillofacial structures were performed using the standard protocol without intravenous contrast. Multiplanar CT image reconstructions of the cervical spine and maxillofacial structures were also generated. COMPARISON:  None. FINDINGS: CT HEAD FINDINGS There is a small amount of fluid in both maxillary sinuses. There is opacification of multiple ethmoid air cells. Mastoid air cells and middle ears are normal. See the facial bone CT report for description bones. There is soft tissue swelling around the right eye. However, the globe is intact an the swelling is all superficial. The extracranial soft tissues are otherwise normal. No subdural, epidural, or subarachnoid hemorrhage. Ventricles and sulci are normal. The cerebellum, brainstem, and basal cisterns are within normal limits. No mass, mass effect, or midline shift. No acute cortical ischemia or infarct. CT MAXILLOFACIAL FINDINGS Soft tissue swelling is seen around the right eye. Periapical lucencies are associated with posterior maxillary molars bilaterally. There is opacification of the bilateral ostiomeatal complexes. No bony fractures are identified. CT CERVICAL SPINE FINDINGS The patient was scanned in partial flexion with mild reversal of normal lordosis. No other malalignment. No fractures are identified. Multilevel degenerative changes are seen with anterior osteophytes at C4-5, C5-6, and C6-7.  There is also calcification of the posterior longitudinal ligament at at least C4, C5, and C6. The calcification of the posterior longitudinal ligament results in canal narrowing at C4, C5, and C6. The AP diameter of the canal at C4 is 7.5 mm centrally and 5.8 mm to the right. No fractures identified. Narrowing of neural foramina is seen on the left at C3-4. IMPRESSION: 1. No acute intracranial process. Opacification of ethmoid sinuses with a small amount of fluid in the maxillary sinuses with no underlying  fracture identified. 2. Soft tissue swelling around the right eye. No facial bone fractures identified. 3. Periapical lucencies around the roots of several posterior maxillary molars. 4. 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 The minimum AP diameter of the canal is 5.8 mm at C4. The narrowing of the canal raises the risk for cord injury. If there is concern, an MRI would be more sensitive. Electronically Signed   By: Gerome Sam III M.D   On: 10/01/2015 16:09   Ct Maxillofacial Wo Cm  10/01/2015  CLINICAL DATA:  Pain after fall. Swelling to left occiput and right eye. EXAM: CT HEAD WITHOUT CONTRAST CT MAXILLOFACIAL WITHOUT CONTRAST CT CERVICAL SPINE WITHOUT CONTRAST TECHNIQUE: Multidetector CT imaging of the head, cervical spine, and maxillofacial structures were performed using the standard protocol without intravenous contrast. Multiplanar CT image reconstructions of the cervical spine and maxillofacial structures were also generated. COMPARISON:  None. FINDINGS: CT HEAD FINDINGS There is a small amount of fluid in both maxillary sinuses. There is opacification of multiple ethmoid air cells. Mastoid air cells and middle ears are normal. See the facial bone CT report for description bones. There is soft tissue swelling around the right eye. However, the globe is intact an the swelling is all superficial. The extracranial soft tissues are otherwise normal. No subdural, epidural, or subarachnoid hemorrhage. Ventricles and sulci are normal. The cerebellum, brainstem, and basal cisterns are within normal limits. No mass, mass effect, or midline shift. No acute cortical ischemia or infarct. CT MAXILLOFACIAL FINDINGS Soft tissue swelling is seen around the right eye. Periapical lucencies are associated with posterior maxillary molars bilaterally. There is opacification of the bilateral ostiomeatal complexes. No bony fractures are  identified. CT CERVICAL SPINE FINDINGS The patient was scanned in partial flexion with mild reversal of normal lordosis. No other malalignment. No fractures are identified. Multilevel degenerative changes are seen with anterior osteophytes at C4-5, C5-6, and C6-7. There is also calcification of the posterior longitudinal ligament at at least C4, C5, and C6. The calcification of the posterior longitudinal ligament results in canal narrowing at C4, C5, and C6. The AP diameter of the canal at C4 is 7.5 mm centrally and 5.8 mm to the right. No fractures identified. Narrowing of neural foramina is seen on the left at C3-4. IMPRESSION: 1. No acute intracranial process. Opacification of ethmoid sinuses with a small amount of fluid in the maxillary sinuses with no underlying fracture identified. 2. Soft tissue swelling around the right eye. No facial bone fractures identified. 3. Periapical lucencies around the roots of several posterior maxillary molars. 4. 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 The minimum AP diameter of the canal is 5.8 mm at C4. The narrowing of the canal raises the risk for cord injury. If there is concern, an MRI would be more sensitive. Electronically Signed  By: Gerome Sam III M.D   On: 10/01/2015 16:09   I have personally reviewed and evaluated these images and lab results as part of my medical decision-making.   EKG Interpretation None      MDM   Final diagnoses:  Concussion, without loss of consciousness, initial encounter  Scalp laceration, initial encounter  Subconjunctival hemorrhage of right eye  Periorbital ecchymosis of right eye, initial encounter  Fall at home, initial encounter    Filed Vitals:   10/01/15 1239 10/01/15 1400 10/01/15 1430  BP: 131/98 112/62 101/55  Pulse: 66 63 55  Temp: 99.7 F (37.6 C)    TempSrc: Oral    Resp: 18 18   SpO2: 100% 98% 99%    Medications   Tdap (BOOSTRIX) injection 0.5 mL (0.5 mLs Intramuscular Given 10/01/15 1438)  HYDROcodone-acetaminophen (NORCO/VICODIN) 5-325 MG per tablet 1 tablet (1 tablet Oral Given 10/01/15 1437)  lidocaine-EPINEPHrine-tetracaine (LET) solution (3 mLs Topical Given 10/01/15 1437)  lidocaine-EPINEPHrine (XYLOCAINE W/EPI) 2 %-1:200000 (PF) injection 20 mL (20 mLs Other Given 10/01/15 1718)    Larry Aguirre is 45 y.o. male presenting with Fall from approximately 18 feet out of a tree, there was no loss of consciousness, he is not anticoagulated, neuro exam is nonfocal. He has right-sided periorbital ecchymosis with subconjunctival hemorrhage, extraocular movement is intact, there is no signs of entrapment. He has a small laceration to the scalp, patient will have head, maxillofacial CT, he will need a C-spine CT only because he fails Nexus criteria secondary to distracting injury., No tenderness to palpation of midline spine he also has some mild tenderness to the left wrist and given the mechanism of injury I would like to image his chest even though there is no objective signs of trauma to the area. Abdominal exam is benign.  CT head is negative, maxillofacial is negative, he has chronic narrowing of the canal and the C-spine, given this patient's normal neurologic exam with full strength and sensation to bilateral upper extremities and the lack of pain in this area I don't think an MRI is indicated at this time.  Wound is closed and counseled patient on wound care and return precautions.  Evaluation does not show pathology that would require ongoing emergent intervention or inpatient treatment. Pt is hemodynamically stable and mentating appropriately. Discussed findings and plan with patient/guardian, who agrees with care plan. All questions answered. Return precautions discussed and outpatient follow up given.   New Prescriptions   HYDROCODONE-ACETAMINOPHEN (NORCO/VICODIN) 5-325 MG TABLET    Take 1-2 tablets by  mouth every 6 hours as needed for pain and/or cough.         Wynetta Emery, PA-C 10/01/15 1727  Samuel Jester, DO 10/02/15 1612  Late entry: Wound is closed with staples  Wynetta Emery, PA-C 10/28/15 1044  Samuel Jester, DO 10/28/15 1530

## 2015-10-01 NOTE — Discharge Instructions (Signed)
Do not participate in any sports or any activities that could result in head trauma until you are cleared by your pediatrician,  primary care physician or neurologist.   Please go to the urgent care or return to the emergency department for staple removal in 8-10 days.  For pain control please take ibuprofen (also known as Motrin or Advil) 800mg  (this is normally 4 over the counter pills) 3 times a day  for 5 days. Take with food to minimize stomach irritation.  Take vicodin for breakthrough pain, do not drink alcohol, drive, care for children or do other critical tasks while taking vicodin.  Wash the affected area with soap and water and apply a thin layer of topical antibiotic ointment. Do this every 12 hours.   Do not use rubbing alcohol or hydrogen peroxide.                        Look for signs of infection: if you see redness, if the area becomes warm, if pain increases sharply, there is discharge (pus), if red streaks appear or you develop fever or vomiting, RETURN immediately to the Emergency Department  for a recheck.    Concussion, Adult A concussion, or closed-head injury, is a brain injury caused by a direct blow to the head or by a quick and sudden movement (jolt) of the head or neck. Concussions are usually not life-threatening. Even so, the effects of a concussion can be serious. If you have had a concussion before, you are more likely to experience concussion-like symptoms after a direct blow to the head.  CAUSES  Direct blow to the head, such as from running into another player during a soccer game, being hit in a fight, or hitting your head on a hard surface.  A jolt of the head or neck that causes the brain to move back and forth inside the skull, such as in a car crash. SIGNS AND SYMPTOMS The signs of a concussion can be hard to notice. Early on, they may be missed by you, family members, and health care providers. You may look fine but act or feel differently. Symptoms  are usually temporary, but they may last for days, weeks, or even longer. Some symptoms may appear right away while others may not show up for hours or days. Every head injury is different. Symptoms include:  Mild to moderate headaches that will not go away.  A feeling of pressure inside your head.  Having more trouble than usual:  Learning or remembering things you have heard.  Answering questions.  Paying attention or concentrating.  Organizing daily tasks.  Making decisions and solving problems.  Slowness in thinking, acting or reacting, speaking, or reading.  Getting lost or being easily confused.  Feeling tired all the time or lacking energy (fatigued).  Feeling drowsy.  Sleep disturbances.  Sleeping more than usual.  Sleeping less than usual.  Trouble falling asleep.  Trouble sleeping (insomnia).  Loss of balance or feeling lightheaded or dizzy.  Nausea or vomiting.  Numbness or tingling.  Increased sensitivity to:  Sounds.  Lights.  Distractions.  Vision problems or eyes that tire easily.  Diminished sense of taste or smell.  Ringing in the ears.  Mood changes such as feeling sad or anxious.  Becoming easily irritated or angry for little or no reason.  Lack of motivation.  Seeing or hearing things other people do not see or hear (hallucinations). DIAGNOSIS Your health care provider can  usually diagnose a concussion based on a description of your injury and symptoms. He or she will ask whether you passed out (lost consciousness) and whether you are having trouble remembering events that happened right before and during your injury. Your evaluation might include:  A brain scan to look for signs of injury to the brain. Even if the test shows no injury, you may still have a concussion.  Blood tests to be sure other problems are not present. TREATMENT  Concussions are usually treated in an emergency department, in urgent care, or at a clinic.  You may need to stay in the hospital overnight for further treatment.  Tell your health care provider if you are taking any medicines, including prescription medicines, over-the-counter medicines, and natural remedies. Some medicines, such as blood thinners (anticoagulants) and aspirin, may increase the chance of complications. Also tell your health care provider whether you have had alcohol or are taking illegal drugs. This information may affect treatment.  Your health care provider will send you home with important instructions to follow.  How fast you will recover from a concussion depends on many factors. These factors include how severe your concussion is, what part of your brain was injured, your age, and how healthy you were before the concussion.  Most people with mild injuries recover fully. Recovery can take time. In general, recovery is slower in older persons. Also, persons who have had a concussion in the past or have other medical problems may find that it takes longer to recover from their current injury. HOME CARE INSTRUCTIONS General Instructions  Carefully follow the directions your health care provider gave you.  Only take over-the-counter or prescription medicines for pain, discomfort, or fever as directed by your health care provider.  Take only those medicines that your health care provider has approved.  Do not drink alcohol until your health care provider says you are well enough to do so. Alcohol and certain other drugs may slow your recovery and can put you at risk of further injury.  If it is harder than usual to remember things, write them down.  If you are easily distracted, try to do one thing at a time. For example, do not try to watch TV while fixing dinner.  Talk with family members or close friends when making important decisions.  Keep all follow-up appointments. Repeated evaluation of your symptoms is recommended for your recovery.  Watch your symptoms  and tell others to do the same. Complications sometimes occur after a concussion. Older adults with a brain injury may have a higher risk of serious complications, such as a blood clot on the brain.  Tell your teachers, school nurse, school counselor, coach, athletic trainer, or work Production designer, theatre/television/filmmanager about your injury, symptoms, and restrictions. Tell them about what you can or cannot do. They should watch for:  Increased problems with attention or concentration.  Increased difficulty remembering or learning new information.  Increased time needed to complete tasks or assignments.  Increased irritability or decreased ability to cope with stress.  Increased symptoms.  Rest. Rest helps the brain to heal. Make sure you:  Get plenty of sleep at night. Avoid staying up late at night.  Keep the same bedtime hours on weekends and weekdays.  Rest during the day. Take daytime naps or rest breaks when you feel tired.  Limit activities that require a lot of thought or concentration. These include:  Doing homework or job-related work.  Watching TV.  Working on the computer.  Avoid any situation where there is potential for another head injury (football, hockey, soccer, basketball, martial arts, downhill snow sports and horseback riding). Your condition will get worse every time you experience a concussion. You should avoid these activities until you are evaluated by the appropriate follow-up health care providers. Returning To Your Regular Activities You will need to return to your normal activities slowly, not all at once. You must give your body and brain enough time for recovery.  Do not return to sports or other athletic activities until your health care provider tells you it is safe to do so.  Ask your health care provider when you can drive, ride a bicycle, or operate heavy machinery. Your ability to react may be slower after a brain injury. Never do these activities if you are dizzy.  Ask  your health care provider about when you can return to work or school. Preventing Another Concussion It is very important to avoid another brain injury, especially before you have recovered. In rare cases, another injury can lead to permanent brain damage, brain swelling, or death. The risk of this is greatest during the first 7-10 days after a head injury. Avoid injuries by:  Wearing a seat belt when riding in a car.  Drinking alcohol only in moderation.  Wearing a helmet when biking, skiing, skateboarding, skating, or doing similar activities.  Avoiding activities that could lead to a second concussion, such as contact or recreational sports, until your health care provider says it is okay.  Taking safety measures in your home.  Remove clutter and tripping hazards from floors and stairways.  Use grab bars in bathrooms and handrails by stairs.  Place non-slip mats on floors and in bathtubs.  Improve lighting in dim areas. SEEK MEDICAL CARE IF:  You have increased problems paying attention or concentrating.  You have increased difficulty remembering or learning new information.  You need more time to complete tasks or assignments than before.  You have increased irritability or decreased ability to cope with stress.  You have more symptoms than before. Seek medical care if you have any of the following symptoms for more than 2 weeks after your injury:  Lasting (chronic) headaches.  Dizziness or balance problems.  Nausea.  Vision problems.  Increased sensitivity to noise or light.  Depression or mood swings.  Anxiety or irritability.  Memory problems.  Difficulty concentrating or paying attention.  Sleep problems.  Feeling tired all the time. SEEK IMMEDIATE MEDICAL CARE IF:  You have severe or worsening headaches. These may be a sign of a blood clot in the brain.  You have weakness (even if only in one hand, leg, or part of the face).  You have  numbness.  You have decreased coordination.  You vomit repeatedly.  You have increased sleepiness.  One pupil is larger than the other.  You have convulsions.  You have slurred speech.  You have increased confusion. This may be a sign of a blood clot in the brain.  You have increased restlessness, agitation, or irritability.  You are unable to recognize people or places.  You have neck pain.  It is difficult to wake you up.  You have unusual behavior changes.  You lose consciousness. MAKE SURE YOU:  Understand these instructions.  Will watch your condition.  Will get help right away if you are not doing well or get worse.   This information is not intended to replace advice given to you by your health care provider.  Make sure you discuss any questions you have with your health care provider.   Document Released: 07/01/2003 Document Revised: 05/01/2014 Document Reviewed: 10/31/2012 Elsevier Interactive Patient Education [employer]

## 2015-10-01 NOTE — ED Notes (Addendum)
States fell out of tree trying to get cat and has a lac to left side of head, bleeding controlled, has a  Rt swollen eye ,  No LOC pt states

## 2015-10-21 ENCOUNTER — Encounter (HOSPITAL_COMMUNITY): Payer: Self-pay | Admitting: Emergency Medicine

## 2015-10-21 ENCOUNTER — Emergency Department (HOSPITAL_COMMUNITY)
Admission: EM | Admit: 2015-10-21 | Discharge: 2015-10-21 | Disposition: A | Discharge status: Home / Self Care | Payer: Managed Care, Other (non HMO) | Attending: Emergency Medicine | Admitting: Emergency Medicine

## 2015-10-21 DIAGNOSIS — Z4802 Encounter for removal of sutures: Secondary | ICD-10-CM

## 2015-10-21 DIAGNOSIS — F17200 Nicotine dependence, unspecified, uncomplicated: Secondary | ICD-10-CM | POA: Insufficient documentation

## 2015-10-21 NOTE — ED Provider Notes (Signed)
CSN: [id number]     Arrival date & time 10/21/15  [id number] History   First MD Initiated Contact with Patient 10/21/15 [phone]     Chief Complaint  Patient presents with  . Suture / Staple Removal     (Consider location/radiation/quality/duration/timing/severity/associated sxs/prior Treatment) HPI Comments: Patient presents to the emergency department with chief complaint of staple removal. Patient had staples placed almost 3 weeks ago. He denies any discharge. Denies any fevers chills. States that the wound has been well healing. Denies any problems. There are no modifying factors. There are no other associated symptoms.  The history is provided by the patient.    Past Medical History  Diagnosis Date  . Hypospadias   . UTI (lower urinary tract infection)   . Syphilis   . H. pylori infection    History reviewed. No pertinent past surgical history. No family history on file. Social History  Substance Use Topics  . Smoking status: Current Every Day Smoker  . Smokeless tobacco: None  . Alcohol Use: Yes    Review of Systems  Skin: Positive for wound.  All other systems reviewed and are negative.     Allergies  Septra  Home Medications   Prior to Admission medications   Medication Sig Start Date End Date Taking? Authorizing Provider  azithromycin (ZITHROMAX) 1 g powder Take 1 packet (1 g total) by mouth once. 05/25/15   Eustace MooreLaura W Murray, MD  HYDROcodone-acetaminophen (NORCO/VICODIN) 5-325 MG tablet Take 1-2 tablets by mouth every 6 hours as needed for pain and/or cough. 10/01/15   Nicole Pisciotta, PA-C  ibuprofen (ADVIL,MOTRIN) 200 MG tablet Take 400 mg by mouth every 6 (six) hours as needed for fever.    Historical Provider, MD  ibuprofen (ADVIL,MOTRIN) 800 MG tablet Take 1 tablet (800 mg total) by mouth 3 (three) times daily. 05/22/15   Domenick GongAshley Mortenson, MD  naproxen (NAPROSYN) 500 MG tablet Take 1 tablet (500 mg total) by mouth 2 (two) times daily. 07/02/15   Joycie PeekBenjamin Cartner, PA-C   omeprazole (PRILOSEC) 20 MG capsule Take 1 capsule (20 mg total) by mouth 2 (two) times daily before a meal. 11/27/14   Fayrene HelperBowie Tran, PA-C  phenazopyridine (PYRIDIUM) 200 MG tablet Take 1 tablet (200 mg total) by mouth 3 (three) times daily as needed for pain. 05/22/15   Domenick GongAshley Mortenson, MD  ranitidine (ZANTAC) 150 MG capsule Take 1 capsule (150 mg total) by mouth daily. 11/27/14   Fayrene HelperBowie Tran, PA-C   BP 140/95 mmHg  Pulse 55  Temp(Src) 98.5 F (36.9 C) (Oral)  Resp 18  Ht 5\' 11"  (1.803 m)  Wt 97.523 kg  BMI 30.00 kg/m2  SpO2 100% Physical Exam  Constitutional: He is oriented to person, place, and time. He appears well-developed and well-nourished.  HENT:  Head: Normocephalic and atraumatic.  Eyes: Conjunctivae and EOM are normal.  Neck: Normal range of motion.  Cardiovascular: Normal rate.   Pulmonary/Chest: Effort normal.  Abdominal: He exhibits no distension.  Musculoskeletal: Normal range of motion.  Neurological: He is alert and oriented to person, place, and time.  Skin: Skin is dry.  Well-healing laceration to scalp  Psychiatric: He has a normal mood and affect. His behavior is normal. Judgment and thought content normal.  Nursing note and vitals reviewed.   ED Course  Procedures (including critical care time) SUTURE REMOVAL Performed by: Roxy HorsemanBROWNING, Azim Gillingham and Zoe, PA-S  Consent: Verbal consent obtained. Consent given by: patient Required items: required blood products, implants, devices, and special equipment available Time  out: Immediately prior to procedure a "time out" was called to verify the correct patient, procedure, equipment, support staff and site/side marked as required.  Location: scalp  Wound Appearance: clean  Sutures/Staples Removed: 3  Patient tolerance: Patient tolerated the procedure well with no immediate complications.     MDM   Final diagnoses:  Removal of staples        Roxy HorsemanRobert Miguelina Fore, PA-C 10/21/15 [phone]  Marily MemosJason Mesner,  MD 10/21/15 1558

## 2015-10-21 NOTE — ED Notes (Signed)
Patient here for staple removal to the L side of head.   No signs or symptoms of infection.  Patient states no problems with the area.  Denies other symptoms.

## 2015-10-21 NOTE — Discharge Instructions (Signed)

## 2017-03-23 ENCOUNTER — Encounter (HOSPITAL_COMMUNITY): Payer: Self-pay | Admitting: *Deleted

## 2017-03-23 ENCOUNTER — Emergency Department (HOSPITAL_COMMUNITY)
Admission: EM | Admit: 2017-03-23 | Discharge: 2017-03-23 | Disposition: A | Discharge status: Home / Self Care | Payer: 59 | Attending: Emergency Medicine | Admitting: Emergency Medicine

## 2017-03-23 DIAGNOSIS — R0602 Shortness of breath: Secondary | ICD-10-CM | POA: Insufficient documentation

## 2017-03-23 DIAGNOSIS — F17200 Nicotine dependence, unspecified, uncomplicated: Secondary | ICD-10-CM | POA: Insufficient documentation

## 2017-03-23 DIAGNOSIS — Z79899 Other long term (current) drug therapy: Secondary | ICD-10-CM | POA: Diagnosis not present

## 2017-03-23 DIAGNOSIS — R1013 Epigastric pain: Secondary | ICD-10-CM | POA: Diagnosis not present

## 2017-03-23 LAB — COMPREHENSIVE METABOLIC PANEL
ALT: 39 U/L (ref 17–63)
AST: 56 U/L — AB (ref 15–41)
Albumin: 3.8 g/dL (ref 3.5–5.0)
Alkaline Phosphatase: 75 U/L (ref 38–126)
Anion gap: 7 (ref 5–15)
BILIRUBIN TOTAL: 0.5 mg/dL (ref 0.3–1.2)
BUN: 12 mg/dL (ref 6–20)
CO2: 22 mmol/L (ref 22–32)
Calcium: 9.1 mg/dL (ref 8.9–10.3)
Chloride: 105 mmol/L (ref 101–111)
Creatinine, Ser: 0.8 mg/dL (ref 0.61–1.24)
GFR calc Af Amer: >60 mL/min (ref >60)
Glucose, Bld: 106 mg/dL — ABNORMAL HIGH (ref 65–99)
POTASSIUM: 3.7 mmol/L (ref 3.5–5.1)
Sodium: 134 mmol/L — ABNORMAL LOW (ref 135–145)
TOTAL PROTEIN: 8 g/dL (ref 6.5–8.1)

## 2017-03-23 LAB — CBC
HEMATOCRIT: 42.5 % (ref 39.0–52.0)
Hemoglobin: 14 g/dL (ref 13.0–17.0)
MCH: 28.6 pg (ref 26.0–34.0)
MCHC: 32.9 g/dL (ref 30.0–36.0)
MCV: 86.7 fL (ref 78.0–100.0)
PLATELETS: 223 10*3/uL (ref 150–400)
RBC: 4.9 MIL/uL (ref 4.22–5.81)
RDW: 13.7 % (ref 11.5–15.5)
WBC: 5.1 10*3/uL (ref 4.0–10.5)

## 2017-03-23 LAB — LIPASE, BLOOD: Lipase: 24 U/L (ref 11–51)

## 2017-03-23 MED ORDER — OMEPRAZOLE 20 MG PO CPDR: 20.0000 mg | DELAYED_RELEASE_CAPSULE | Freq: Two times a day (BID) | ORAL | 0 refills | Status: DC

## 2017-03-23 MED ORDER — RANITIDINE HCL 150 MG PO CAPS: 150.0000 mg | ORAL_CAPSULE | Freq: Every day | ORAL | 0 refills | Status: DC

## 2017-03-23 NOTE — ED Notes (Signed)
Pt came up to front desk to talk to this RN about wait time. Pt informed of emergency room process and pt continued talking over this RN. Pt instructed that he would go back as soon as we had an appropriate room available. Pt continued to be verbally aggressive.

## 2017-03-23 NOTE — ED Provider Notes (Signed)
[facility]   CSN: [id number] Arrival date & time: 03/23/17  [id number]     History   Chief Complaint Chief Complaint  Patient presents with  . Abdominal Pain    HPI Larry Aguirre is a 46 y.o. male.  HPI   Patient with history of GERD, H. pylori infection presenting for evaluation of abdominal pain.  Patient report pain to his epigastric region worsening this morning that felt similar to prior heartburn.  He missed work today and decided to come to the ER for symptomatic treatment as well as a work note.  He does not have a primary care provider and request for a visit for resource.  He denies having fever, chills, productive cough, hemoptysis, hematemesis, hematochezia or melena.  He did report having shortness of breath when the pain is intense but that has since resolved.  No specific treatment tried today.  Past Medical History:  Diagnosis Date  . H. pylori infection   . Hypospadias   . Syphilis   . UTI (lower urinary tract infection)     There are no active problems to display for this patient.   History reviewed. No pertinent surgical history.     Home Medications    Prior to Admission medications   Medication Sig Start Date End Date Taking? Authorizing Provider  azithromycin (ZITHROMAX) 1 g powder Take 1 packet (1 g total) by mouth once. 05/25/15   Eustace MooreMurray, Laura W, MD  HYDROcodone-acetaminophen (NORCO/VICODIN) 5-325 MG tablet Take 1-2 tablets by mouth every 6 hours as needed for pain and/or cough. 10/01/15   Pisciotta, Joni ReiningNicole, PA-C  ibuprofen (ADVIL,MOTRIN) 200 MG tablet Take 400 mg by mouth every 6 (six) hours as needed for fever.    [provider]  ibuprofen (ADVIL,MOTRIN) 800 MG tablet Take 1 tablet (800 mg total) by mouth 3 (three) times daily. 05/22/15   Domenick GongMortenson, Ashley, MD  naproxen (NAPROSYN) 500 MG tablet Take 1 tablet (500 mg total) by mouth 2 (two) times daily. 07/02/15   Cartner, Sharlet SalinaBenjamin, PA-C    omeprazole (PRILOSEC) 20 MG capsule Take 1 capsule (20 mg total) by mouth 2 (two) times daily before a meal. 11/27/14   Fayrene Helperran, Kalaya Infantino, PA-C  phenazopyridine (PYRIDIUM) 200 MG tablet Take 1 tablet (200 mg total) by mouth 3 (three) times daily as needed for pain. 05/22/15   Domenick GongMortenson, Ashley, MD  ranitidine (ZANTAC) 150 MG capsule Take 1 capsule (150 mg total) by mouth daily. 11/27/14   Fayrene Helperran, Xylina Rhoads, PA-C    Family History History reviewed. No pertinent family history.  Social History Social History   Tobacco Use  . Smoking status: Current Every Day Smoker  Substance Use Topics  . Alcohol use: Yes  . Drug use: No     Allergies   Septra [sulfamethoxazole-trimethoprim]   Review of Systems Review of Systems  All other systems reviewed and are negative.    Physical Exam Updated Vital Signs BP 113/70 (BP Location: Right Arm)   Pulse (!) 138   Temp 98.6 F (37 C) (Oral)   Resp 18   Ht 6' (1.829 m)   Wt 93 kg (205 lb)   SpO2 99%   BMI 27.80 kg/m   Physical Exam  Constitutional: He appears well-developed and well-nourished. No distress.  HENT:  Head: Atraumatic.  Eyes: Conjunctivae are normal.  Neck: Neck supple.  Cardiovascular: Normal rate and regular rhythm.  Pulmonary/Chest: Effort normal and breath sounds normal.  Abdominal: Soft. Bowel sounds are  normal. There is no tenderness.  Neurological: He is alert.  Skin: No rash noted.  Psychiatric: He has a normal mood and affect.  Nursing note and vitals reviewed.    ED Treatments / Results  Labs (all labs ordered are listed, but only abnormal results are displayed) Labs Reviewed  COMPREHENSIVE METABOLIC PANEL - Abnormal; Notable for the following components:      Result Value   Sodium 134 (*)    Glucose, Bld 106 (*)    AST 56 (*)    All other components within normal limits  LIPASE, BLOOD  CBC  URINALYSIS, ROUTINE W REFLEX MICROSCOPIC    EKG  EKG Interpretation None       Radiology No results  found.  Procedures Procedures (including critical care time)  Medications Ordered in ED Medications - No data to display   Initial Impression / Assessment and Plan / ED Course  I have reviewed the triage vital signs and the nursing notes.  Pertinent labs & imaging results that were available during my care of the patient were reviewed by me and considered in my medical decision making (see chart for details).     BP (!) 134/95 (BP Location: Right Arm)   Pulse (!) 55   Temp 98.6 F (37 C) (Oral)   Resp 18   Ht 6' (1.829 m)   Wt 93 kg (205 lb)   SpO2 97%   BMI 27.80 kg/m    Final Clinical Impressions(s) / ED Diagnoses   Final diagnoses:  Epigastric pain    ED Discharge Orders    None     3:37 PM Patient with history of heartburn here with epigastric tenderness similar to prior heartburn.  He has a fairly benign abdominal exam, he is in no acute discomfort, labs are reassuring.  He is here requesting for a work note, medication refill, and outpatient follow-up.  All of which were given as per request.  Return precautions discussed.   Fayrene Helperran, Markavious Micco, PA-C 03/23/17 1537    Mancel BaleWentz, Elliott, MD 03/23/17 [phone]

## 2017-03-23 NOTE — ED Notes (Signed)
Patient given discharge instructions and verbalized understanding.  Patient stable to discharge at this time.  Patient is alert and oriented to baseline.  No distressed noted at this time.  All belongings taken with the patient at discharge.   

## 2017-03-23 NOTE — ED Notes (Signed)
Patient was entering room and yelling at staff saying that he "was paying for this visit and he wanted to see a Dr. Right now!!"  Patient was advised that we would be in to see him soon and he demanded to know when.  Patient was advised that

## 2017-03-23 NOTE — ED Triage Notes (Signed)
Pt reports having intermittent abd pain, became more severe last night. Pt states it is due to acid reflux but does not take medicines for it.

## 2017-07-23 ENCOUNTER — Encounter (HOSPITAL_COMMUNITY): Payer: Self-pay | Admitting: Emergency Medicine

## 2017-07-23 ENCOUNTER — Emergency Department (HOSPITAL_COMMUNITY)
Admission: EM | Admit: 2017-07-23 | Discharge: 2017-07-23 | Disposition: A | Discharge status: Home / Self Care | Payer: 59 | Attending: Emergency Medicine | Admitting: Emergency Medicine

## 2017-07-23 DIAGNOSIS — K0889 Other specified disorders of teeth and supporting structures: Secondary | ICD-10-CM | POA: Insufficient documentation

## 2017-07-23 DIAGNOSIS — F17200 Nicotine dependence, unspecified, uncomplicated: Secondary | ICD-10-CM | POA: Diagnosis not present

## 2017-07-23 DIAGNOSIS — Z79899 Other long term (current) drug therapy: Secondary | ICD-10-CM | POA: Diagnosis not present

## 2017-07-23 MED ORDER — AMOXICILLIN 500 MG PO CAPS: 500.0000 mg | ORAL_CAPSULE | Freq: Three times a day (TID) | ORAL | 0 refills | Status: DC

## 2017-07-23 MED ORDER — NAPROXEN 250 MG PO TABS
500.0000 mg | ORAL_TABLET | Freq: Once | ORAL | Status: AC
  Administered 2017-07-23: 500 mg via ORAL
  Filled 2017-07-23: qty 2

## 2017-07-23 MED ORDER — AMOXICILLIN 500 MG PO CAPS
500.0000 mg | ORAL_CAPSULE | Freq: Once | ORAL | Status: AC
  Administered 2017-07-23: 500 mg via ORAL
  Filled 2017-07-23: qty 1

## 2017-07-23 MED ORDER — NAPROXEN 500 MG PO TABS: 500.0000 mg | ORAL_TABLET | Freq: Two times a day (BID) | ORAL | 0 refills | Status: DC

## 2017-07-23 NOTE — ED Provider Notes (Signed)
[facility] EMERGENCY DEPARTMENT Provider Note   CSN: [id number] Arrival date & time: 07/23/17  2032     History   Chief Complaint Chief Complaint  Patient presents with  . Dental Pain    HPI Larry Aguirre is a 47 y.o. male with a hx of tobacco abuse who presents to the ED complaining of L upper dental pain x 3 days. Patient states pain is constant, rates it a 10/10 in severity, worse with eating/cold air. States he tried OTC medication without relief. Hx of dental problems, states he has not seen a dentist in a long time. Denies fever, chills, neck stiffness, or difficulty swallowing.   HPI  Past Medical History:  Diagnosis Date  . H. pylori infection   . Hypospadias   . Syphilis   . UTI (lower urinary tract infection)     There are no active problems to display for this patient.   History reviewed. No pertinent surgical history.      Home Medications    Prior to Admission medications   Medication Sig Start Date End Date Taking? Authorizing Provider  azithromycin (ZITHROMAX) 1 g powder Take 1 packet (1 g total) by mouth once. 05/25/15   Isa Rankin, MD  HYDROcodone-acetaminophen (NORCO/VICODIN) 5-325 MG tablet Take 1-2 tablets by mouth every 6 hours as needed for pain and/or cough. 10/01/15   Pisciotta, Joni Reining, PA-C  ibuprofen (ADVIL,MOTRIN) 200 MG tablet Take 400 mg by mouth every 6 (six) hours as needed for fever.    [provider]  ibuprofen (ADVIL,MOTRIN) 800 MG tablet Take 1 tablet (800 mg total) by mouth 3 (three) times daily. 05/22/15   Domenick Gong, MD  naproxen (NAPROSYN) 500 MG tablet Take 1 tablet (500 mg total) by mouth 2 (two) times daily. 07/02/15   Cartner, Sharlet Salina, PA-C  omeprazole (PRILOSEC) 20 MG capsule Take 1 capsule (20 mg total) by mouth 2 (two) times daily before a meal. 03/23/17   Fayrene Helper, PA-C  phenazopyridine (PYRIDIUM) 200 MG tablet Take 1 tablet (200 mg total) by mouth 3 (three) times daily as needed  for pain. 05/22/15   Domenick Gong, MD  ranitidine (ZANTAC) 150 MG capsule Take 1 capsule (150 mg total) by mouth daily. 03/23/17   Fayrene Helper, PA-C    Family History History reviewed. No pertinent family history.  Social History Social History   Tobacco Use  . Smoking status: Current Every Day Smoker  . Smokeless tobacco: Never Used  Substance Use Topics  . Alcohol use: Yes  . Drug use: No     Allergies   Septra [sulfamethoxazole-trimethoprim]   Review of Systems Review of Systems  Constitutional: Negative for chills and fever.  HENT: Positive for congestion and dental problem. Negative for ear pain, sore throat, trouble swallowing and voice change.   Respiratory: Negative for shortness of breath.   Musculoskeletal: Negative for neck pain and neck stiffness.     Physical Exam Updated Vital Signs BP 117/85 (BP Location: Right Arm)   Pulse 69   Temp 98.2 F (36.8 C) (Oral)   Resp 18   Ht 5\' 11"  (1.803 m)   Wt 97.5 kg (215 lb)   SpO2 100%   BMI 29.99 kg/m   Physical Exam  Constitutional: He appears well-developed and well-nourished.  Non-toxic appearance. No distress.  HENT:  Head: Normocephalic and atraumatic.  Right Ear: Tympanic membrane is not perforated, not erythematous, not retracted and not bulging.  Left Ear: Tympanic membrane is not perforated,  not erythematous, not retracted and not bulging.  Nose: Mucosal edema (mild congestion) present.  Mouth/Throat: Uvula is midline and oropharynx is clear and moist. No oropharyngeal exudate or posterior oropharyngeal erythema.  Patient is tolerating his own secretions without difficulty.  No drooling.  No trismus.  No hot potato voice.  Submandibular compartment is soft.  Patient has poor dentition throughout with multiple missing teeth, dental caries, and a few teeth decayed to the gumline.  Left upper molar gingival area is erythematous and tender to palpation.  No palpable fluctuance.  No gross abscess.  No  evidence of deep space infection.  Eyes: Conjunctivae are normal. Right eye exhibits no discharge. Left eye exhibits no discharge.  Neck: Normal range of motion. Neck supple.  Cardiovascular: Normal rate and regular rhythm.  Pulmonary/Chest: Effort normal and breath sounds normal.  Lymphadenopathy:    He has no cervical adenopathy.  Neurological: He is alert.  Psychiatric: He has a normal mood and affect. His behavior is normal. Thought content normal.  Nursing note and vitals reviewed.   ED Treatments / Results  Labs (all labs ordered are listed, but only abnormal results are displayed) Labs Reviewed - No data to display  EKG None  Radiology No results found.  Procedures Procedures (including critical care time)  Medications Ordered in ED Medications  naproxen (NAPROSYN) tablet 500 mg (has no administration in time range)  amoxicillin (AMOXIL) capsule 500 mg (has no administration in time range)     Initial Impression / Assessment and Plan / ED Course  I have reviewed the triage vital signs and the nursing notes.  Pertinent labs & imaging results that were available during my care of the patient were reviewed by me and considered in my medical decision making (see chart for details).    Patient presents with dental pain. Patient is nontoxic appearing, in no apparent distress, vitals WNL.  No gross abscess.  Exam unconcerning for Ludwig's angina or spread of infection.  Will treat with Amoxicillin and Naproxen.  Urged patient to follow-up with dentist, dental resources were provided.  Discussed treatment plan and need for follow up as well as return precautions. Provided opportunity for questions, patient confirmed understanding and is agreeable to plan.  Final Clinical Impressions(s) / ED Diagnoses   Final diagnoses:  Pain, dental    ED Discharge Orders        Ordered    naproxen (NAPROSYN) 500 MG tablet  2 times daily     07/23/17 2140    amoxicillin (AMOXIL) 500  MG capsule  3 times daily     07/23/17 2140       Cherly Andersonetrucelli, Maelani Yarbro R, PA-C 07/23/17 2215    Terrilee FilesButler, Michael C, MD 07/24/17 [phone]

## 2017-07-23 NOTE — ED Triage Notes (Signed)
Pt reports L upper dental pain X few days. Pt has poor dental hygiene. Pt states he has a dentist but has not called for an appointment.

## 2017-07-23 NOTE — Discharge Instructions (Addendum)
Call one of the dentists offices provided to schedule an appointment for re-evaluation and further management within the next 48 hours.   I have prescribed you amoxicillin which is an antibiotic to treat the infection and Naproxen which is an anti-inflammatory medicine to treat the pain.   Please take all of your antibiotics until finished. You may develop abdominal discomfort or diarrhea from the antibiotic.  You may help offset this with probiotics which you can buy at the store (ask your pharmacist if unable to find) or get probiotics in the form of eating yogurt. Do not eat or take the probiotics until 2 hours after your antibiotic. If you are unable to tolerate these side effects follow-up with your primary care provider or return to the emergency department.   If you begin to experience any blistering, rashes, swelling, or difficulty breathing seek medical care for evaluation of potentially more serious side effects.   Be sure to eat something when taking the Naproxen as it can cause stomach upset and at worst stomach bleeding. Do not take additional non steroidal anti-inflammatory medicines such as Ibuprofen, Aleve, or Advil while taking Naproxen. You may supplement with Tylenol.   We have prescribed you new medication(s) today. Discuss the medications prescribed today with your pharmacist as they can have adverse effects and interactions with your other medicines including over the counter and prescribed medications. Seek medical evaluation if you start to experience new or abnormal symptoms after taking one of these medicines, seek care immediately if you start to experience difficulty breathing, feeling of your throat closing, facial swelling, or rash as these could be indications of a more serious allergic reaction   If you start to experience and new or worsening symptoms return to the emergency department. If you start to experience fever, chills, neck stiffness/pain, or inability to move  your neck come back to the emergency department immediately.

## 2017-08-30 ENCOUNTER — Emergency Department (HOSPITAL_COMMUNITY)
Admission: EM | Admit: 2017-08-30 | Discharge: 2017-08-30 | Disposition: A | Discharge status: Home / Self Care | Payer: Self-pay | Attending: Emergency Medicine | Admitting: Emergency Medicine

## 2017-08-30 ENCOUNTER — Encounter (HOSPITAL_COMMUNITY): Payer: Self-pay | Admitting: Emergency Medicine

## 2017-08-30 ENCOUNTER — Emergency Department (HOSPITAL_COMMUNITY): Payer: Self-pay

## 2017-08-30 DIAGNOSIS — F17210 Nicotine dependence, cigarettes, uncomplicated: Secondary | ICD-10-CM | POA: Insufficient documentation

## 2017-08-30 DIAGNOSIS — M549 Dorsalgia, unspecified: Secondary | ICD-10-CM

## 2017-08-30 DIAGNOSIS — R0789 Other chest pain: Secondary | ICD-10-CM | POA: Insufficient documentation

## 2017-08-30 DIAGNOSIS — M546 Pain in thoracic spine: Secondary | ICD-10-CM | POA: Insufficient documentation

## 2017-08-30 DIAGNOSIS — Z79899 Other long term (current) drug therapy: Secondary | ICD-10-CM | POA: Insufficient documentation

## 2017-08-30 LAB — BASIC METABOLIC PANEL
Anion gap: 9 (ref 5–15)
BUN: 17 mg/dL (ref 6–20)
CALCIUM: 9.5 mg/dL (ref 8.9–10.3)
CO2: 26 mmol/L (ref 22–32)
CREATININE: 0.76 mg/dL (ref 0.61–1.24)
Chloride: 110 mmol/L (ref 101–111)
GFR calc Af Amer: >60 mL/min (ref >60)
GLUCOSE: 98 mg/dL (ref 65–99)
Potassium: 3.9 mmol/L (ref 3.5–5.1)
Sodium: 145 mmol/L (ref 135–145)

## 2017-08-30 LAB — CBC
HEMATOCRIT: 41.2 % (ref 39.0–52.0)
Hemoglobin: 13.7 g/dL (ref 13.0–17.0)
MCH: 28.8 pg (ref 26.0–34.0)
MCHC: 33.3 g/dL (ref 30.0–36.0)
MCV: 86.6 fL (ref 78.0–100.0)
Platelets: 222 10*3/uL (ref 150–400)
RBC: 4.76 MIL/uL (ref 4.22–5.81)
RDW: 13.5 % (ref 11.5–15.5)
WBC: 5.9 10*3/uL (ref 4.0–10.5)

## 2017-08-30 LAB — I-STAT TROPONIN, ED: TROPONIN I, POC: 0.01 ng/mL (ref 0.00–0.08)

## 2017-08-30 LAB — D-DIMER, QUANTITATIVE: D-Dimer, Quant: 0.29 ug/mL-FEU (ref 0.00–0.50)

## 2017-08-30 MED ORDER — CYCLOBENZAPRINE HCL 10 MG PO TABS: 10.0000 mg | ORAL_TABLET | Freq: Two times a day (BID) | ORAL | 0 refills | Status: DC | PRN

## 2017-08-30 MED ORDER — KETOROLAC TROMETHAMINE 15 MG/ML IJ SOLN
30.0000 mg | Freq: Once | INTRAMUSCULAR | Status: AC
  Administered 2017-08-30: 30 mg via INTRAMUSCULAR
  Filled 2017-08-30 (×2): qty 2

## 2017-08-30 MED ORDER — IBUPROFEN 600 MG PO TABS: 600.0000 mg | ORAL_TABLET | Freq: Three times a day (TID) | ORAL | 0 refills | Status: DC | PRN

## 2017-08-30 NOTE — ED Notes (Signed)
Bed: WA20 Expected date:  Expected time:  Means of arrival:  Comments: [place] [facility]

## 2017-08-30 NOTE — Discharge Instructions (Signed)
Please return for any problem.  Use ibuprofen and Flexeril as instructed.  Close follow-up with a regular care provider is advised.

## 2017-08-30 NOTE — ED Provider Notes (Signed)
[facility]-EMERGENCY DEPT Provider Note   CSN: [id number] Arrival date & time: 08/30/17  1430     History   Chief Complaint Chief Complaint  Patient presents with  . Chest Pain  . Numbness    HPI Larry Aguirre is a 47 y.o. male.  47 year old male without significant prior medical history presents with complaint of left-sided chest and back pain.  Patient reports that his pain started on Monday of this past week.  His pain is been ongoing for the last 4 days.  He decided that today he should get it "checked out."  He describes a constant dull ache to the left upper back and left upper chest.  This pain is worse with movement or lifting with his left arm.  He denies associated shortness of breath, nausea, vomiting, diaphoresis, or other acute complaint.  He has not taken anything at home for his symptoms.  Patient denies prior history of CAD or other cardiac disease.  The history is provided by the patient.  Chest Pain   This is a new problem. The current episode started more than 2 days ago. The problem occurs constantly. The problem has not changed since onset.The pain is associated with lifting, movement and raising an arm. The pain is present in the lateral region. The pain is mild. The quality of the pain is described as sharp. The pain does not radiate. Duration of episode(s) is 4 days. The symptoms are aggravated by certain positions. He has tried nothing for the symptoms. The treatment provided no relief.    Past Medical History:  Diagnosis Date  . H. pylori infection   . Hypospadias   . Syphilis   . UTI (lower urinary tract infection)     There are no active problems to display for this patient.   History reviewed. No pertinent surgical history.      Home Medications    Prior to Admission medications   Medication Sig Start Date End Date Taking? Authorizing Provider  amoxicillin (AMOXIL) 500 MG capsule Take 1 capsule (500 mg total) by mouth  3 (three) times daily. 07/23/17   Petrucelli, Pleas Koch, PA-C  HYDROcodone-acetaminophen (NORCO/VICODIN) 5-325 MG tablet Take 1-2 tablets by mouth every 6 hours as needed for pain and/or cough. 10/01/15   Pisciotta, Joni Reining, PA-C  naproxen (NAPROSYN) 500 MG tablet Take 1 tablet (500 mg total) by mouth 2 (two) times daily. 07/23/17   Petrucelli, Pleas Koch, PA-C  omeprazole (PRILOSEC) 20 MG capsule Take 1 capsule (20 mg total) by mouth 2 (two) times daily before a meal. 03/23/17   Fayrene Helper, PA-C  phenazopyridine (PYRIDIUM) 200 MG tablet Take 1 tablet (200 mg total) by mouth 3 (three) times daily as needed for pain. 05/22/15   Domenick Gong, MD  ranitidine (ZANTAC) 150 MG capsule Take 1 capsule (150 mg total) by mouth daily. 03/23/17   Fayrene Helper, PA-C    Family History No family history on file.  Social History Social History   Tobacco Use  . Smoking status: Current Every Day Smoker  . Smokeless tobacco: Never Used  Substance Use Topics  . Alcohol use: Yes  . Drug use: No     Allergies   Septra [sulfamethoxazole-trimethoprim]   Review of Systems Review of Systems  Cardiovascular: Positive for chest pain.  All other systems reviewed and are negative.    Physical Exam Updated Vital Signs There were no vitals taken for this visit.  Physical Exam  Constitutional: He is oriented  to person, place, and time. He appears well-developed and well-nourished. No distress.  HENT:  Head: Normocephalic and atraumatic.  Mouth/Throat: Oropharynx is clear and moist.  Eyes: Pupils are equal, round, and reactive to light. Conjunctivae and EOM are normal.  Neck: Normal range of motion. Neck supple.  Cardiovascular: Normal rate, regular rhythm and normal heart sounds.  Pulmonary/Chest: Effort normal and breath sounds normal. No respiratory distress.  Abdominal: Soft. He exhibits no distension. There is no tenderness.  Musculoskeletal: Normal range of motion. He exhibits no edema or  deformity.  Neurological: He is alert and oriented to person, place, and time.  Skin: Skin is warm and dry.  Psychiatric: He has a normal mood and affect.  Nursing note and vitals reviewed.    ED Treatments / Results  Labs (all labs ordered are listed, but only abnormal results are displayed) Labs Reviewed  BASIC METABOLIC PANEL  CBC  D-DIMER, QUANTITATIVE (NOT AT [facility])  I-STAT TROPONIN, ED    EKG EKG Interpretation  Date/Time:  Thursday Aug 30 2017 14:44:33 EDT Ventricular Rate:  59 PR Interval:    QRS Duration: 96 QT Interval:  408 QTC Calculation: 405 R Axis:   39 Text Interpretation:  Sinus rhythm Ventricular premature complex Confirmed by Kristine Royal [phone]) on 08/30/2017 3:21:10 PM   Radiology Dg Chest 2 View  Result Date: 08/30/2017 CLINICAL DATA:  Chest pain and left arm numbness EXAM: CHEST - 2 VIEW COMPARISON:  10/01/2015 FINDINGS: The heart size and mediastinal contours are within normal limits. Both lungs are clear. The visualized skeletal structures are unremarkable. IMPRESSION: No active cardiopulmonary disease. Electronically Signed   By: Deatra Robinson M.D.   On: 08/30/2017 15:18    Procedures Procedures (including critical care time)  Medications Ordered in ED Medications  ketorolac (TORADOL) 15 MG/ML injection 30 mg (has no administration in time range)     Initial Impression / Assessment and Plan / ED Course  I have reviewed the triage vital signs and the nursing notes.  Pertinent labs & imaging results that were available during my care of the patient were reviewed by me and considered in my medical decision making (see chart for details).     MDM  Screen complete  Patient is presenting for evaluation of left-sided upper back pain.  Pain is primarily localized to the distribution of the left trapezius.  His pain is quite typical for a musculoskeletal strain.  His initial symptoms as described during my evaluation are not suggestive of ACS.   His EKG is without significant abnormality.  Troponin x1 is negative.  Chest x-ray is without significant abnormality.  Screening CBC and BMP also do not find significant abnormality.  Patient feels significantly improved following administration of Toradol in the ED.  At time of discharge, he was improved.  He is aware of the need for close follow-up.  Strict return precautions are given and understood.    Final Clinical Impressions(s) / ED Diagnoses   Final diagnoses:  Back pain, unspecified back location, unspecified back pain laterality, unspecified chronicity    ED Discharge Orders        Ordered    ibuprofen (ADVIL,MOTRIN) 600 MG tablet  Every 8 hours PRN     08/30/17 1701    cyclobenzaprine (FLEXERIL) 10 MG tablet  2 times daily PRN     08/30/17 1701       Wynetta Fines, MD 08/30/17 1705

## 2017-08-30 NOTE — ED Triage Notes (Signed)
Patient here from work with sudden onset chest pain and left arm numbness. Nausea, diaphoretic. Reports that pain is better now since in triage.

## 2017-09-09 IMAGING — DX DG CHEST 2V
2 series · 2 of 2 positions shown · non-contrast
Comparison: None.

CLINICAL DATA: 45-year-old male with a history of fall

EXAM:
CHEST  2 VIEW

[w chest pa]
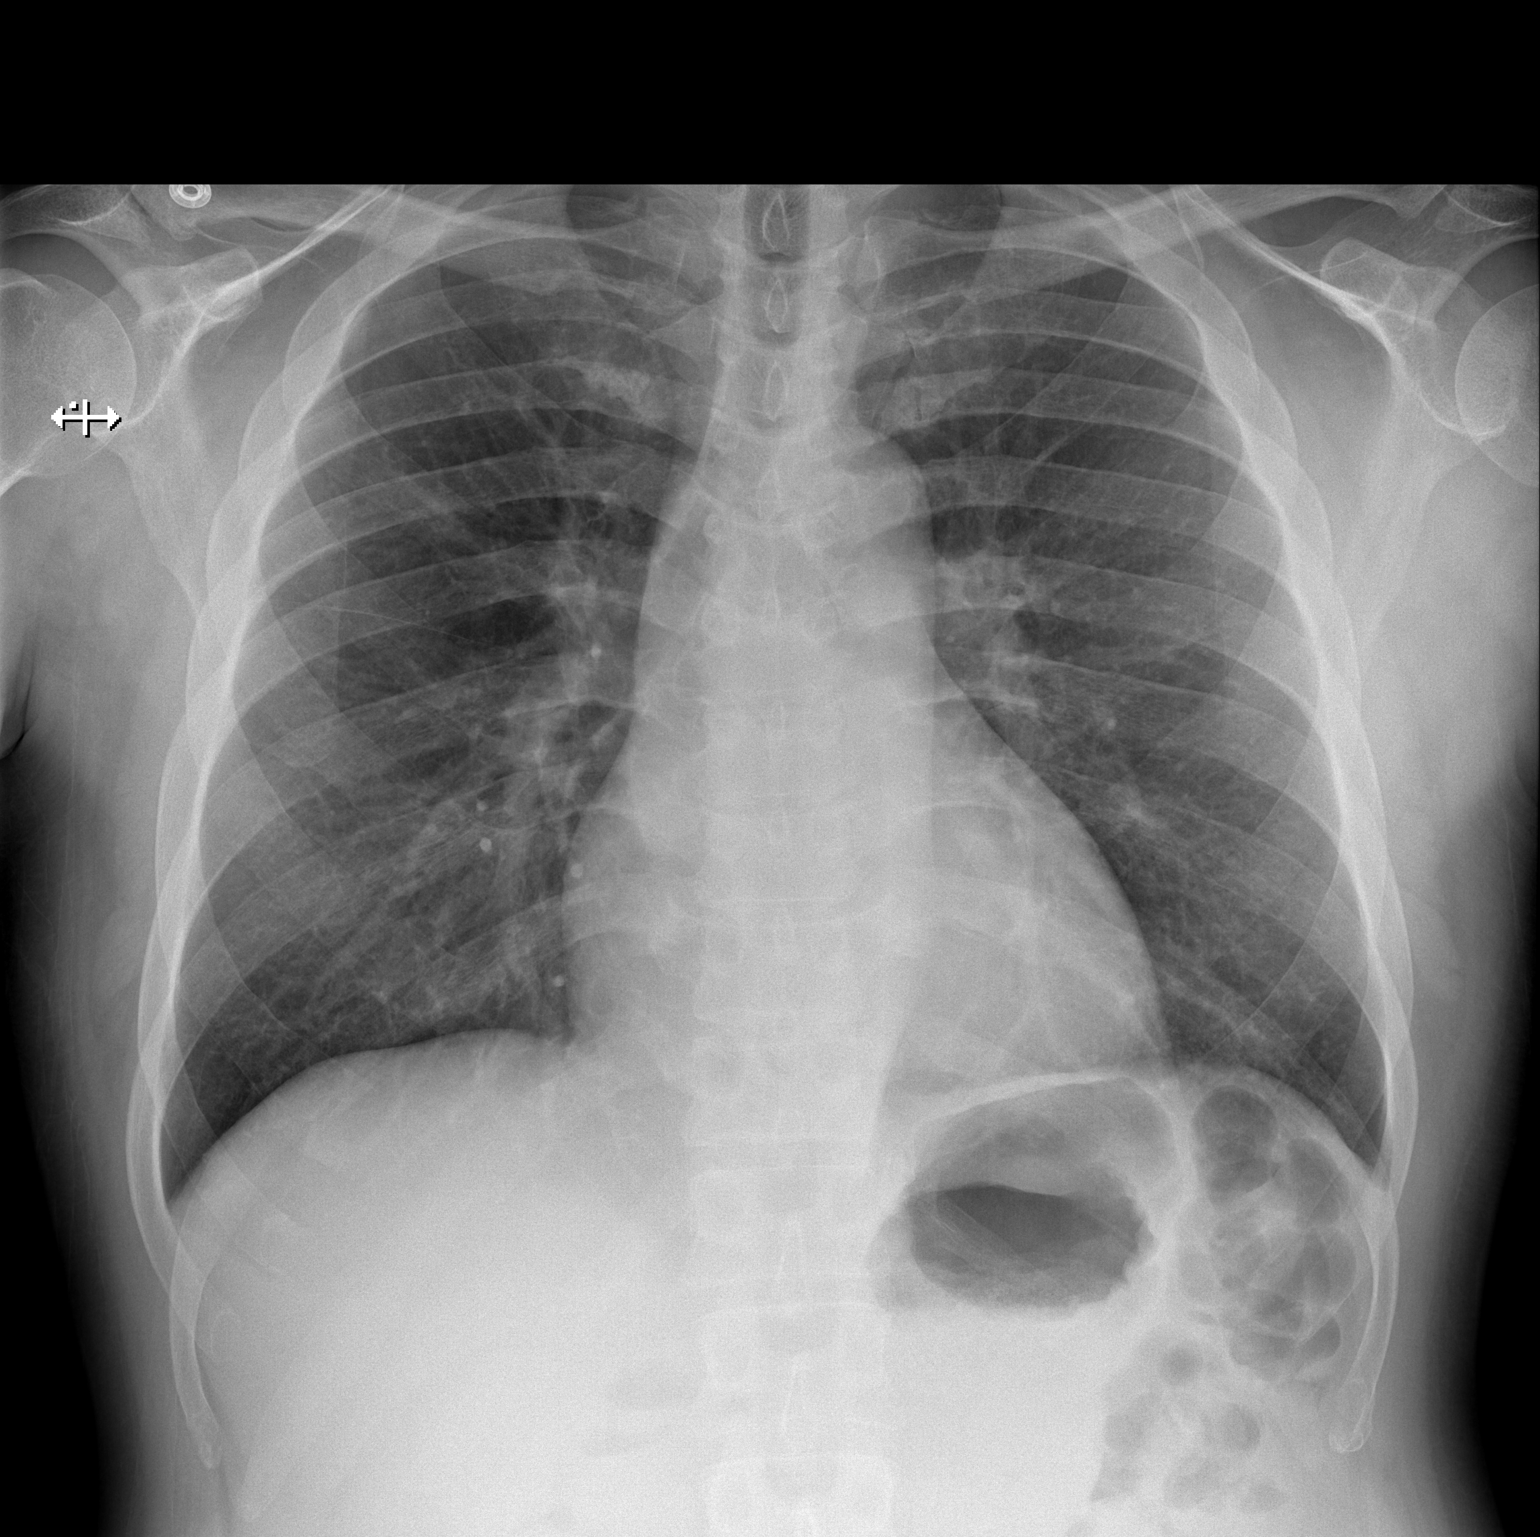

[w chest lat]
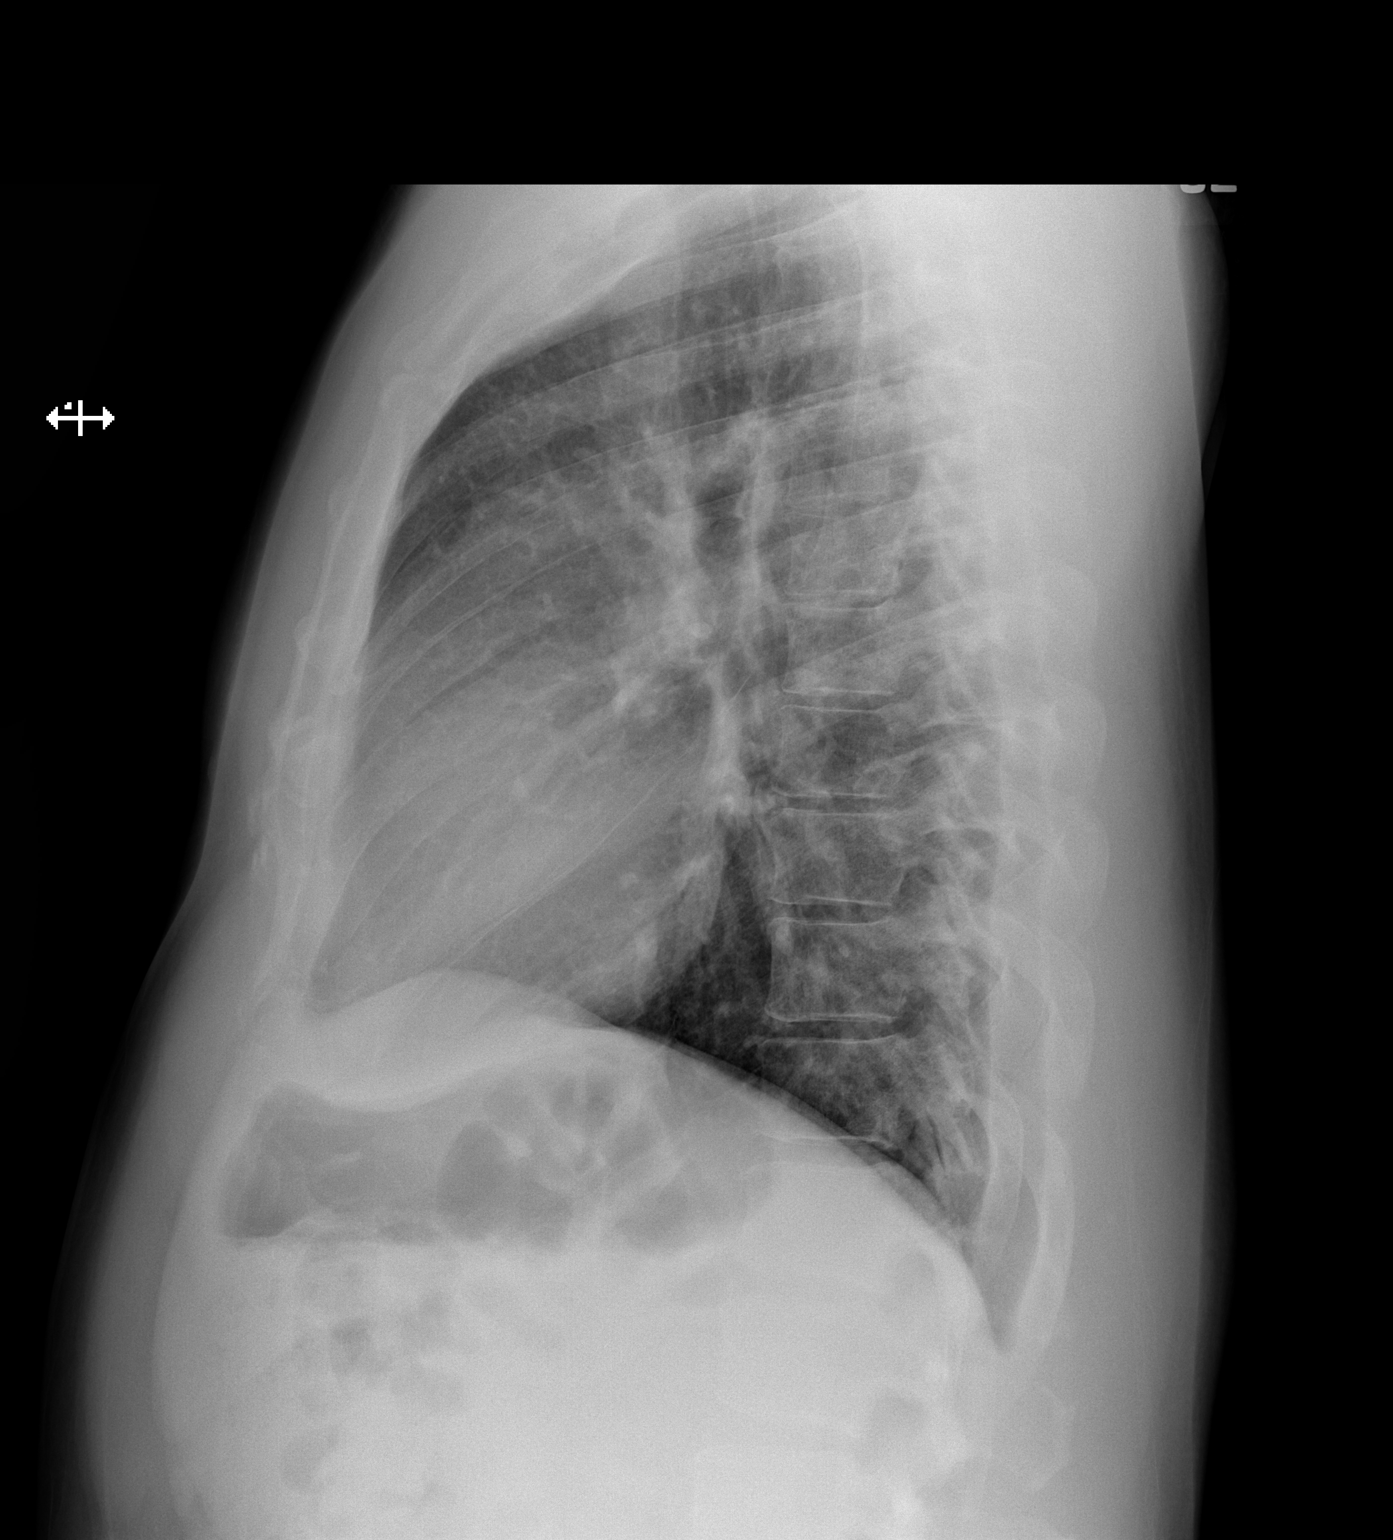

[2 of 2 positions shown; findings below may reference images not displayed]

FINDINGS: The heart size and mediastinal contours are within normal limits.
Both lungs are clear. The visualized skeletal structures are
unremarkable.
IMPRESSION: Negative for acute cardiopulmonary disease.

## 2017-10-29 ENCOUNTER — Encounter (HOSPITAL_COMMUNITY): Payer: Self-pay | Admitting: Emergency Medicine

## 2017-10-29 ENCOUNTER — Ambulatory Visit (HOSPITAL_COMMUNITY)
Admission: EM | Admit: 2017-10-29 | Discharge: 2017-10-29 | Disposition: A | Discharge status: Home / Self Care | Payer: Self-pay | Attending: Family Medicine | Admitting: Family Medicine

## 2017-10-29 DIAGNOSIS — R0602 Shortness of breath: Secondary | ICD-10-CM

## 2017-10-29 DIAGNOSIS — Z77120 Contact with and (suspected) exposure to mold (toxic): Secondary | ICD-10-CM

## 2017-10-29 NOTE — Discharge Instructions (Signed)
You are well today without any adverse response to the mold you have detected in your home. I do recommend treatment of this mold.  May cause trigger of allergies or asthma, may use zyrtec if needed. Please establish with a primary care provider for long term management and reevaluation if needed.  See provided information about mold management and detection.     Assessment of mold in the indoor environment Author: Sonda Primes, PhD, [facility], FAAAAI Section Editor: Milinda Hirschfeld, MD Deputy Editor: Lynetta Mare, MD Contributor Disclosures All topics are updated as new evidence becomes available and our peer review process is complete. Literature review current through: Jun 2019.   This topic last updated: Jul 31, 2017. INTRODUCTION -- Mold is ubiquitous outdoors, and it is readily introduced or physically transported into the home, work, and school indoor environments. This topic will focus strictly on the detection of mold in indoor environments. The proven and unproven health effects of indoor molds are discussed separately. (See "The role of fungi (molds) in human disease".) A more general discussion of assessment of allergens in the indoor environment is found separately. (See "Allergen sampling in the environment".) TERMINOLOGY -- The generic term "mold" encompasses many types of fungi, a diverse class of eukaryotic micro-organisms that live on organic nutrients. The kingdom Fungi is comprised of plants without leaves, flowers, or roots that reproduce from spores and includes yeasts, molds, smuts, and mushrooms (table 1). Molds lack chlorophyll and vascular tissue. They range in form from a single cell to a body mass of branched filamentous hyphae that spread into and feed off of dead organic matter or living organisms. PROPERTIES OF MOLDS -- Molds produce specialized, uniquely structured, fruiting bodies called spores that are generally invisible to the naked eye (table 1). The  unique features of these spores are used to speciate the mold by microscopic, immunologic, and molecular biology techniques. The majority of the spores produced by molds range in size from 2 to 20 microns in diameter and 1 to 100 mm in length. These spores can become readily airborne. The spores appear as oblong and irregular biomasses, with visually characteristic shapes, sizes, and colors that can be discriminated from each other microscopically (figure 1 and figure 2 and picture 1). Morphologically, the spore surface under a microscope may appear as smooth, granular, warty, spiny, cup-shaped, pie-crust shaped, or reticulate (figure 3). Mold spores need a relative humidity >65 percent, a temperature between 50 to 81F (10 to 32C), and organic matter as their nutrient base to grow. When a spore germinates, it produces hyphae or branches that digest organic matter on the growing surface. The general appearance of mold varies widely in texture from a filamentous cottony or granular display to a leathery or smooth velvety surface. Mold can present as colorless or with a white, gray, brown, black, yellow, green, or fluorescent hue (picture 1). As mold grows, it often exudes a "musty" odor that results from volatile organic compound vapors that are released. There are approximately 100 indoor molds that have been identified as potentially hazardous to human health, although only a small subset of these are commonly found in the air and dust in indoor environments (table 2 and table 3) [1,2]. The classification of medically important molds, some that are known to elicit immunoglobulin E (IgE) antibodies in humans, is presented in the table (table 4). DETECTION OF INDOOR MOLD GROWTH -- There is often mold growth where there has been water damage or constant high humidity. Invisible  spores can be transported indoors on clothing, shoes, or pets, or they may blow in through open doors, windows, or ventilation systems.  Spores can survive on wood, paper, carpet, soil, plants, fabrics, and other surfaces. As they settle on a surface with the right humidity and temperature, mold spores begin to germinate, gradually producing an increasingly expanding network of hyphae [3]. Odors -- The nose may be the first sensory organ to identify a mold infestation as a result of the pungent organic compounds molds release into the air. Any musty odor raises suspicion that a major mold contamination is present, even if it cannot be readily seen. Visual signs -- An initial indoor environmental assessment involves a visual inspection of the floor, walls, windows, and air handling system. Mold can sometimes be visually detected as a discolored surface. The environment should also be inspected for sources of dampness or standing water. Visible mold can be remediated without further testing or analysis. (See 'Prevention and remediation' below.) INDICATIONS FOR ASSESSMENT -- Before pursuing a professional assessment of mold in an indoor environment, it is important to understand the limitations of these analyses. In the [place] (and many other countries), there are no federal limits for acceptable levels of mold spores in any indoor environment. Thus, the demonstration of mold in an indoor environment does not provide definitive information for making decisions about remediation. However, it can provide additional information about the presence of mold in an indoor environment if simple visual inspection is equivocal or negative and the nose detects a mold problem. Therefore, the following criteria should be met before professional evaluation of the home and work/school environment for mold is performed: ?The patient has a confirmed condition known to be related to fungus, such as a hypersensitivity disorder (eg, asthma, allergic rhinitis, hypersensitivity pneumonitis, or sinusitis) or a severe fungal infection (especially in an  immunocompromised individual). (See "The role of fungi (molds) in human disease".) ?There is evidence of exposure to a specific fungus that is known to cause the condition the patient has (table 3). (See 'Detection of indoor mold growth' above.) Guidance for health care providers in determining when a home assessment for potential mold exposure is warranted have been published [4]. This is based on questions, shown in the table, that can help identify an association between mold exposure and symptoms (table 5) [5]. The indoor environments to which the patient is exposed should be evaluated for growth of the implicated species of fungi if these two criteria are met. Sampling and testing are generally not necessary if visual inspection reveals mold, since laboratory testing will likely not change the decision to remediate the environment. However, further assessment is indicated if no mold is seen but musty odors suggest mold contamination or if there is a known history of water damage in that environment. ASSESSMENT FOR MOLD -- The systematic process of assessing an environment for the presence of mold involves two phases: ?Sampling of the environment ?Analysis to detect evidence of specific mold(s) In general, no single laboratory collection method or analytic test can be used to definitively type and quantify mold in an environment, since molds are continually changing with time. We recommend that the person performing the assessment be a Programmer, systems, since this ensures a defined level of training and knowledge. Sampling methods -- A number of environmental sampling methods are used to assess the burden of mold in an environment [6]. The four principal collection techniques include: ?Obtaining a bulk "reservoir" dust from the floor or  ventilation system ?Sampling of a piece of the physical surface that is discolored ?Performing a residual surface swipe on a suspected contaminated  surface ?Obtaining an air sample using one of several multistage samplers that are often equipped with a nutrient-filled culture plate Bulk reservoir dust collection -- A bulk surface "reservoir" dust specimen is a sample of accumulated particulate released from the air over time. It can be collected using a clean vacuum device that is equipped with any of a number of new collection unit attachments (eg, nozzle sock) [7]. Sampling should be performed where individuals spend the majority of their time in the environment (eg, bedroom, family room, kitchen, study) and where the symptoms are believed to be induced. For practical reasons, the mattress in a bedroom is vacuumed first, followed by upholstered furniture, and then carpeting. The bare floor adjacent to a major item of upholstered furniture is vacuumed if carpet is not present. A global specimen may be taken from the major areas where the individual spends most of his or her time and where he or she feels mold contamination is most likely if cost is an issue. However, separate testing of rooms allows more definitive testing of specific areas. The collected material may include associated debris (sand, human and animal hair, textile fibers, lint, human dander, coins, or insect parts). Once collected, the bulk dust is processed by passing it through a 50-mesh metal sieve. This removes irrelevant particulate and allows 300-micron diameter and smaller particles including mold spores <20 microns to pass through. A second form of bulk sampling involves removal of a piece of actual discolored surface material from the wall or floor. Often material from different sites is evaluated. Each is stored in a sterile plastic bag prior to evaluation. These specimens are more problematic to test, as they are heterogeneous, and there is no reference method for processing them for culture or microscopic analysis. Once in the laboratory, a portion of the sample can be  mounted on a silicone grease or petroleum gel-covered slide, stained, and examined microscopically for the presence of characteristically shaped fungal spores. This method provides a measure of the total spore count. It is more difficult to perform with a piece of wallboard or carpet on which mold contamination is suspected. A second portion of the bulk dust may be evaluated in a microbiologic assay for viable mold spores. One method involves distributing 5 mg of sieved fine dust from the bulk specimen in a physiologic buffer onto medium in a culture plate that contains antibiotics to prevent bacterial growth. Mold spores, if present and viable, grow into colonies that can be visually enumerated at 24, 48, or 72 hours. Some normative data are available on the number of viable colonies per gram of surface dust in homes across the [place] [1]. Surface wipe sampling -- Mold that settles out from air onto various indoor surfaces can be collected using a surface swab or clear adhesive tape transfer technique. Contents on the swab are released into a physiologic buffer (eg, phosphate-buffered saline) and delivered onto a culture plate in a manner similar to that used for the bulk dust sample. The adhesive tape contact sample can alternatively be stained and analyzed for spores microscopically. The smoother the surface being sampled, the greater the efficiency of adhesive tape collection. Swab collection techniques are sometimes problematic in that the spores may be damaged during sampling, which compromises viability. Both swab and tape sampling are inexpensive, readily mastered, and nondestructive sampling techniques. However, they are highly  variable in terms of their ability to collect reproducible test specimens that produce comparable colony counts upon analysis. If the surface being evaluated is a filter, then vacuum and swab samples were superior to the cut and elute collection method in terms of recovery  and diversity of mold species [8]. Sampling of air -- Aerosolized spores can be sampled by gravity samplers, inertial impactors, or filtration devices [6]. The suction impactors are generally preferred for mold evaluation, since they have the highest efficiency of mold spore collection and they allow automated monitoring over a multiday period. ?The gravity sampler allows particles to fall on a collecting surface, usually an adhesive-coated glass slide or an open culture plate containing various media (eg, chloramphenicol/potato dextrose agar and malt extract agar) [9]. Gravity generally plays a small part in the collection, because air is never still. The larger the particle size, the more efficient the collection in a gravity sampler. ?Inertial impactors depend on particle motion for collection. They either pass airborne particles across a stationary surface using a fan or suction apparatus or they move the collection surface through the air at a constant speed. Collection efficiency of inertial impactors depends on the particle size, with smaller particles remaining in the air stream and having a lower probability of impacting on the collection surface that is coated with a sticky substance (eg, thin layer of silicon grease). Rotation impactors, such as the rotarod, liquid impingers, and suction impactors, are included in this group. The rotarod uses a 1.2 mm-wide plastic rod with the leading edge that is greased. It is satisfactory for collecting particles down to 10 microns in diameter, which includes some larger mold spores. ?Suction impactors include slit samplers (eg, Burkard spore trap) that draw measured quantities of air across a narrow slit onto a moving drum over a 24-hour or seven-day sampling period. Alternatively, cascade impactors (eg, Andersen sampler) draw air over a large entrance orifice and accelerate it stepwise through plates of increasingly smaller pore or sieve sizes. Increasing the  speed causes smaller particles to be trapped more efficiently. These samplers discriminate between particle sizes and are better at trapping small mold spores than the inertial impactors or gravity samplers. Petri dishes filled with media nutrients can also be placed in the Anon Raices sampler to directly collect viable spores with different particle sizes for culturing and further analysis. Air sampling methods are unfortunately prone to contamination by collection of molds that may enter the air space but that are not representative of the molds that have caused the environmental contamination of concern. This can occur as a result of open doors or windows that allow contaminating or irrelevant molds into the air space during the sampling period. In addition, different molds are more or less effectively trapped, depending on their size and shape. Moreover, air sampling may sometimes be inexact, because spores can be in an inactive form at the time they are collected, since biologic activity requires humid, warm, nutrient-filled conditions that may not be present at the time of sampling. These inactive spores are not contributing to mold contamination observed in an environment at the time of sampling, but they can become activated when cultured and will be included in the quantified spore count. Analysis methods -- Once the specimen has been collected, it can be analyzed by bioassay, biochemical assay, culture, direct microscopy, nucleic acid analysis, or immunologic assay to identify and quantify the specific molds in the sample. Only a few of these methods are discussed, since a combination of these, often  involving purification from a culture, are most widely used to speciate and quantify the mold content of indoor environments where visual inspection has failed to detect a mold infestation. Direct microscopy -- Visual identification of mold should be performed by a certified technician who is familiar with the  morphologic forms of different fungal spores. Direct microscopic examination is used to quantify the total number of fungal spores on a slide. One limitation of this technique is that it quantifies the total fungal spore burden (both viable and inactive spores), since only a percentage of fungal spores that are counted will be viable and therefore contribute to widespread mold contamination of the environment. Culture -- Culture is commonly performed, regardless of the collection method, to expand the spore number so that other laboratory techniques can more readily detect the various molds present. A nutrient medium, with antibiotics such as penicillin and gentamicin to prevent bacterial growth, allows growth of viable spores. The individual colonies are then identified macroscopically, microscopically, biochemically, immunologically, or based on their nucleic acid profile. The number of viable mold spores are then reported at 24 or 48 hours as colony counts or colony-forming units (CFUs) per gram of bulk dust or colonies per cubic meters of air sampled. The culture procedure has technical limitations that can lead to underestimates of the actual spore levels for several reasons: ?A high number of bacteria can occasionally exceed the capacity of the antibiotics in the medium to inhibit growth. This will result in the plated medium becoming covered with bacterial colony clusters, which are morphologically distinct from mold colony-forming units. A result of this nature will be reported as "high bacterial contamination, mold could not be assessed in this specimen." ?Some spores have highly specialized growth conditions that are not mimicked by the contents of the culture plate. ?Certain spores produce inhibitors that can prevent growth. ?Overcrowding induces soluble inhibition factors, and growth of colonies may diminish as a result of contact suppression. ?The collection process often has a desiccating effect  that reduces the viability of some spores. ?Most spores have limited viability periods when airborne, and these time intervals vary among the different spore types and release times during the day. Nucleic acid analysis -- A mold-specific, quantitative polymerase chain reaction (PCR) assay is a laboratory tool for assessing the content of mold in extracts and surface dust. One group used a PCR-based assay to detect the presence and relative levels in house dust of 26 mold species associated with water damage and 10 mold species not associated with water damage. An environmental relative moldiness index (ERMI) was developed using the level of these molds. These 36 molds were measured in the dust from 271 homes of children with asthma. The ability of ERMI level to predict the development of respiratory illness (wheeze and/or rhinitis) was then compared with a binary classification of homes as either moldy or non-moldy, based on onsite inspection [10,11]. The visual onsite home inspection was not predictive of respiratory illness, while the ERMI was able to predict the occurrence of illness in homes containing mold associated with water damage. Immunologic assays -- Monoclonal antibody-based immunoenzymometric assays are available to quantify the level of selected individual mold allergens in extracts of surface dust or cultures. "Indicator" allergen assays for Aspergillus fumigatus (Asp f group 1 [Ribotoxin]), Alternaria alternata (Alt a group 1 allergen [acidic glycoprotein]), and antigens of Aspergillus versicolor and Stachybotrys chartarum can be performed and are sometimes useful. However, the technique has a serious limitation in that nutrient requirements are not  always optimal for efficient growth of these molds and expression of their allergenic proteins. Thus, Aspergillus fumigatus or Alternaria alternata in particular may be present in the specimen but their specific allergenic proteins are not expressed  and thus undetectable by immunoassay. For this reason, quantitative immunoassay analysis is not performed as a routine clinical test but rather is considered a research tool. PREVENTION AND REMEDIATION -- Academic and federal agencies have developed guidelines for homeowners on the issues of mold, moisture, and health in the home [12,13]. Information is also available at the [employer]. Since mold is ever-present in environments, the absolute level of spore contamination can be used to make decisions about costly remediation of indoor environments. Mold contamination should be expected in cases of water damage. Caution, however, should be exercised when interpreting mold spore or mold-specific allergen levels, as these are rarely quantitative, and they are often generated using samples that may not be representative of the entire environmental area of concern. Direct comparison of levels against outdoor spore counts is also not generally useful. Thus, relative levels of mold spores or allergen levels need to be judged conservatively and where possible within the context of an onsite environmental survey. Reducing humidity by increasing ventilation, covering cold surfaces such as water pipes with insulation, and increasing the air temperature to reduce surface humidity are inexpensive actions that can help discourage mold expansion (table 6). Plumbing leaks and other sources of dampness or standing water should also be fixed. Visible mold can be remediated using several simple measures (table 6). The least expensive and most effective means of removing surface mold involves scrubbing contaminated nonporous hard surfaces with detergent and water and then drying the area completely. Disinfectants or biocides, such as a diluted chlorine bleach solution (eg, 10 percent), are not usually necessary. Any contaminated areas in which mold has embedded itself, such as a porous  wall, floor, carpet, or upholstered area, need to be removed or replaced. A certified Insurance claims handler is preferred if professional remediation is required. SUMMARY AND RECOMMENDATIONS ?Every indoor environment has some mold present, and moisture control is the key to mold growth control. Mold spores need a relative humidity >65 percent, a temperature between 50 to 6F (10 to 32C), and organic matter as their nutrient base to grow. (See 'Properties of molds' above.) ?The initial inspection for indoor mold includes examination for musty odors and visible mold on surfaces. Visible mold can be remediated without further testing or analysis. (See 'Detection of indoor mold growth' above.) ?There are no federal limits for acceptable levels of mold spores in any indoor environment. Thus, mold spore confirmation testing does not provide definitive information for decisions about remediation. (See 'Indications for assessment' above.) ?A professional assessment for mold in the indoor environment is not indicated unless a patient has a confirmed condition known to be related to fungus and there is evidence of exposure to the specific fungus that causes that condition. The indoor environments to which the patient is exposed should be evaluated for growth of the implicated species of fungi if these two criteria are met. Sampling and testing is generally not necessary if visual inspection reveals mold. However, further assessment is indicated if no mold is seen on visual inspection but a musty odor suggests mold contamination. (See 'Indications for assessment' above and 'Assessment for mold' above.) ?Measures that can be taken to discourage mold growth in the indoor environment include reducing humidity by increasing ventilation, covering cold surfaces such as water pipes  with insulation, and increasing the air temperature. Washing nonporous surfaces with detergent and water and drying the area effectively address  surface mold contamination. Contaminated porous surface areas in which the mold is embedded must be removed. (See 'Prevention and remediation' above.) (table 6).

## 2017-10-29 NOTE — ED Provider Notes (Signed)
[facility]    CSN: [id number] Arrival date & time: 10/29/17  1925     History   Chief Complaint Chief Complaint  Patient presents with  . Shortness of Breath    HPI Larry Aguirre is a 47 y.o. male.   Larry Aguirre presents with concerns about discovering mold in his home in the AC vents. States he is concerned about possible health effects. Does have someone coming out to the apartment to evaluate this tomorrow. States he has resided there for approximately 5 months. No current complaints however. Asks "what does mold cause because I probably do have that." Occasional shortness of breath, no current. Occasional np cough, rare. No congestion. No fevers. Doesn't smoke. No wheezing. Hx of uti, hpylori, hypospadias.    ROS per HPI.      Past Medical History:  Diagnosis Date  . H. pylori infection   . Hypospadias   . Syphilis   . UTI (lower urinary tract infection)     There are no active problems to display for this patient.   History reviewed. No pertinent surgical history.     Home Medications    Prior to Admission medications   Medication Sig Start Date End Date Taking? Authorizing Provider  amoxicillin (AMOXIL) 500 MG capsule Take 1 capsule (500 mg total) by mouth 3 (three) times daily. Patient not taking: Reported on 08/30/2017 07/23/17   Petrucelli, Samantha R, PA-C  cyclobenzaprine (FLEXERIL) 10 MG tablet Take 1 tablet (10 mg total) by mouth 2 (two) times daily as needed for muscle spasms. 08/30/17   Messick, Peter C, MD  ibuprofen (ADVIL,MOTRIN) 600 MG tablet Take 1 tablet (600 mg total) by mouth every 8 (eight) hours as needed. 08/30/17   Messick, Peter C, MD  naproxen (NAPROSYN) 500 MG tablet Take 1 tablet (500 mg total) by mouth 2 (two) times daily. Patient not taking: Reported on 08/30/2017 07/23/17   Petrucelli, Samantha R, PA-C    Family History No family history on file.  Social History Social History   Tobacco Use  . Smoking status: Current Every  Day Smoker  . Smokeless tobacco: Never Used  Substance Use Topics  . Alcohol use: Yes  . Drug use: No     Allergies   Septra [sulfamethoxazole-trimethoprim]   Review of Systems Review of Systems   Physical Exam Triage Vital Signs ED Triage Vitals [10/29/17 1956]  Enc Vitals Group     BP 131/67     Pulse Rate 65     Resp 16     Temp 98.9 F (37.2 C)     Temp Source Oral     SpO2 98 %     Weight 200 lb (90.7 kg)     Height      Head Circumference      Peak Flow      Pain Score 0     Pain Loc      Pain Edu?      Excl. in GC?    No data found.  Updated Vital Signs BP 131/67   Pulse 65   Temp 98.9 F (37.2 C) (Oral)   Resp 16   Wt 200 lb (90.7 kg)   SpO2 98%   BMI 27.89 kg/m   Visual Acuity Right Eye Distance:   Left Eye Distance:   Bilateral Distance:    Right Eye Near:   Left Eye Near:    Bilateral Near:     Physical Exam  Constitutional: He is oriented   to person, place, and time. He appears well-developed and well-nourished.  Cardiovascular: Normal rate and regular rhythm.  Pulmonary/Chest: Effort normal and breath sounds normal. No tachypnea. No respiratory distress. He has no decreased breath sounds. He has no wheezes. He has no rales.  Musculoskeletal: Normal range of motion.  Neurological: He is alert and oriented to person, place, and time.  Skin: Skin is warm and dry.  Vitals reviewed.    UC Treatments / Results  Labs (all labs ordered are listed, but only abnormal results are displayed) Labs Reviewed - No data to display  EKG None  Radiology No results found.  Procedures Procedures (including critical care time)  Medications Ordered in UC Medications - No data to display  Initial Impression / Assessment and Plan / UC Course  I have reviewed the triage vital signs and the nursing notes.  Pertinent labs & imaging results that were available during my care of the patient were reviewed by me and considered in my medical  decision making (see chart for details).     Patient without any specific complaints at this time, non toxic in appearance, without findings on exam. Concerned about mold exposure in his home. Discussed caring for this, potential risks and things to monitor for. Education provided about mold treatment. Return precautions provided. Patient verbalized understanding and agreeable to plan.    Final Clinical Impressions(s) / UC Diagnoses   Final diagnoses:  Mold exposure     Discharge Instructions     You are well today without any adverse response to the mold you have detected in your home. I do recommend treatment of this mold.  May cause trigger of allergies or asthma, may use zyrtec if needed. Please establish with a primary care provider for long term management and reevaluation if needed.  See provided information about mold management and detection.     Assessment of mold in the indoor environment Author: Robert G Hamilton, PhD, DABMLI, FAAAAI Section Editor: Bruce S Bochner, MD Deputy Editor: Anna M Feldweg, MD Contributor Disclosures All topics are updated as new evidence becomes available and our peer review process is complete. Literature review current through: Jun 2019.  This topic last updated: Jul 31, 2017. INTRODUCTION - Mold is ubiquitous outdoors, and it is readily introduced or physically transported into the home, work, and school indoor environments. This topic will focus strictly on the detection of mold in indoor environments. The proven and unproven health effects of indoor molds are discussed separately. (See "The role of fungi (molds) in human disease".) A more general discussion of assessment of allergens in the indoor environment is found separately. (See "Allergen sampling in the environment".) TERMINOLOGY - The generic term "mold" encompasses many types of fungi, a diverse class of eukaryotic micro-organisms that live on organic nutrients. The kingdom  Fungi is comprised of plants without leaves, flowers, or roots that reproduce from spores and includes yeasts, molds, smuts, and mushrooms (table 1). Molds lack chlorophyll and vascular tissue. They range in form from a single cell to a body mass of branched filamentous hyphae that spread into and feed off of dead organic matter or living organisms. PROPERTIES OF MOLDS - Molds produce specialized, uniquely structured, fruiting bodies called spores that are generally invisible to the naked eye (table 1). The unique features of these spores are used to speciate the mold by microscopic, immunologic, and molecular biology techniques. The majority of the spores produced by molds range in size from 2 to 20 microns in diameter   and 1 to 100 mm in length. These spores can become readily airborne. The spores appear as oblong and irregular biomasses, with visually characteristic shapes, sizes, and colors that can be discriminated from each other microscopically (figure 1 and figure 2 and picture 1). Morphologically, the spore surface under a microscope may appear as smooth, granular, warty, spiny, cup-shaped, pie-crust shaped, or reticulate (figure 3). Mold spores need a relative humidity >65 percent, a temperature between 50 to 19F (10 to 32C), and organic matter as their nutrient base to grow. When a spore germinates, it produces hyphae or branches that digest organic matter on the growing surface. The general appearance of mold varies widely in texture from a filamentous cottony or granular display to a leathery or smooth velvety surface. Mold can present as colorless or with a white, gray, brown, black, yellow, green, or fluorescent hue (picture 1). As mold grows, it often exudes a "musty" odor that results from volatile organic compound vapors that are released. There are approximately 100 indoor molds that have been identified as potentially hazardous to human health, although only a small subset of these are  commonly found in the air and dust in indoor environments (table 2 and table 3) [1,2]. The classification of medically important molds, some that are known to elicit immunoglobulin E (IgE) antibodies in humans, is presented in the table (table 4). DETECTION OF INDOOR MOLD GROWTH - There is often mold growth where there has been water damage or constant high humidity. Invisible spores can be transported indoors on clothing, shoes, or pets, or they may blow in through open doors, windows, or ventilation systems. Spores can survive on wood, paper, carpet, soil, plants, fabrics, and other surfaces. As they settle on a surface with the right humidity and temperature, mold spores begin to germinate, gradually producing an increasingly expanding network of hyphae [3]. Odors - The nose may be the first sensory organ to identify a mold infestation as a result of the pungent organic compounds molds release into the air. Any musty odor raises suspicion that a major mold contamination is present, even if it cannot be readily seen. Visual signs - An initial indoor environmental assessment involves a visual inspection of the floor, walls, windows, and air handling system. Mold can sometimes be visually detected as a discolored surface. The environment should also be inspected for sources of dampness or standing water. Visible mold can be remediated without further testing or analysis. (See 'Prevention and remediation' below.) INDICATIONS FOR ASSESSMENT - Before pursuing a professional assessment of mold in an indoor environment, it is important to understand the limitations of these analyses. In the [place] (and many other countries), there are no federal limits for acceptable levels of mold spores in any indoor environment. Thus, the demonstration of mold in an indoor environment does not provide definitive information for making decisions about remediation. However, it can provide additional information about the  presence of mold in an indoor environment if simple visual inspection is equivocal or negative and the nose detects a mold problem. Therefore, the following criteria should be met before professional evaluation of the home and work/school environment for mold is performed: ?The patient has a confirmed condition known to be related to fungus, such as a hypersensitivity disorder (eg, asthma, allergic rhinitis, hypersensitivity pneumonitis, or sinusitis) or a severe fungal infection (especially in an immunocompromised individual). (See "The role of fungi (molds) in human disease".) ?There is evidence of exposure to a specific fungus that is known to cause  the condition the patient has (table 3). (See 'Detection of indoor mold growth' above.) Guidance for health care providers in determining when a home assessment for potential mold exposure is warranted have been published [4]. This is based on questions, shown in the table, that can help identify an association between mold exposure and symptoms (table 5) [5]. The indoor environments to which the patient is exposed should be evaluated for growth of the implicated species of fungi if these two criteria are met. Sampling and testing are generally not necessary if visual inspection reveals mold, since laboratory testing will likely not change the decision to remediate the environment. However, further assessment is indicated if no mold is seen but musty odors suggest mold contamination or if there is a known history of water damage in that environment. ASSESSMENT FOR MOLD - The systematic process of assessing an environment for the presence of mold involves two phases: ?Sampling of the environment ?Analysis to detect evidence of specific mold(s) In general, no single laboratory collection method or analytic test can be used to definitively type and quantify mold in an environment, since molds are continually changing with time. We recommend that the person  performing the assessment be a certified industrial hygienist, since this ensures a defined level of training and knowledge. Sampling methods - A number of environmental sampling methods are used to assess the burden of mold in an environment [6]. The four principal collection techniques include: ?Obtaining a bulk "reservoir" dust from the floor or ventilation system ?Sampling of a piece of the physical surface that is discolored ?Performing a residual surface swipe on a suspected contaminated surface ?Obtaining an air sample using one of several multistage samplers that are often equipped with a nutrient-filled culture plate Bulk reservoir dust collection - A bulk surface "reservoir" dust specimen is a sample of accumulated particulate released from the air over time. It can be collected using a clean vacuum device that is equipped with any of a number of new collection unit attachments (eg, nozzle sock) [7]. Sampling should be performed where individuals spend the majority of their time in the environment (eg, bedroom, family room, kitchen, study) and where the symptoms are believed to be induced. For practical reasons, the mattress in a bedroom is vacuumed first, followed by upholstered furniture, and then carpeting. The bare floor adjacent to a major item of upholstered furniture is vacuumed if carpet is not present. A global specimen may be taken from the major areas where the individual spends most of his or her time and where he or she feels mold contamination is most likely if cost is an issue. However, separate testing of rooms allows more definitive testing of specific areas. The collected material may include associated debris (sand, human and animal hair, textile fibers, lint, human dander, coins, or insect parts). Once collected, the bulk dust is processed by passing it through a 50-mesh metal sieve. This removes irrelevant particulate and allows 300-micron diameter and smaller particles  including mold spores <20 microns to pass through. A second form of bulk sampling involves removal of a piece of actual discolored surface material from the wall or floor. Often material from different sites is evaluated. Each is stored in a sterile plastic bag prior to evaluation. These specimens are more problematic to test, as they are heterogeneous, and there is no reference method for processing them for culture or microscopic analysis. Once in the laboratory, a portion of the sample can be mounted on a silicone grease or petroleum   gel-covered slide, stained, and examined microscopically for the presence of characteristically shaped fungal spores. This method provides a measure of the total spore count. It is more difficult to perform with a piece of wallboard or carpet on which mold contamination is suspected. A second portion of the bulk dust may be evaluated in a microbiologic assay for viable mold spores. One method involves distributing 5 mg of sieved fine dust from the bulk specimen in a physiologic buffer onto medium in a culture plate that contains antibiotics to prevent bacterial growth. Mold spores, if present and viable, grow into colonies that can be visually enumerated at 24, 48, or 72 hours. Some normative data are available on the number of viable colonies per gram of surface dust in homes across the [place] [1]. Surface wipe sampling - Mold that settles out from air onto various indoor surfaces can be collected using a surface swab or clear adhesive tape transfer technique. Contents on the swab are released into a physiologic buffer (eg, phosphate-buffered saline) and delivered onto a culture plate in a manner similar to that used for the bulk dust sample. The adhesive tape contact sample can alternatively be stained and analyzed for spores microscopically. The smoother the surface being sampled, the greater the efficiency of adhesive tape collection. Swab collection techniques are  sometimes problematic in that the spores may be damaged during sampling, which compromises viability. Both swab and tape sampling are inexpensive, readily mastered, and nondestructive sampling techniques. However, they are highly variable in terms of their ability to collect reproducible test specimens that produce comparable colony counts upon analysis. If the surface being evaluated is a filter, then vacuum and swab samples were superior to the cut and elute collection method in terms of recovery and diversity of mold species [8]. Sampling of air - Aerosolized spores can be sampled by gravity samplers, inertial impactors, or filtration devices [6]. The suction impactors are generally preferred for mold evaluation, since they have the highest efficiency of mold spore collection and they allow automated monitoring over a multiday period. ?The gravity sampler allows particles to fall on a collecting surface, usually an adhesive-coated glass slide or an open culture plate containing various media (eg, chloramphenicol/potato dextrose agar and malt extract agar) [9]. Gravity generally plays a small part in the collection, because air is never still. The larger the particle size, the more efficient the collection in a gravity sampler. ?Inertial impactors depend on particle motion for collection. They either pass airborne particles across a stationary surface using a fan or suction apparatus or they move the collection surface through the air at a constant speed. Collection efficiency of inertial impactors depends on the particle size, with smaller particles remaining in the air stream and having a lower probability of impacting on the collection surface that is coated with a sticky substance (eg, thin layer of silicon grease). Rotation impactors, such as the rotarod, liquid impingers, and suction impactors, are included in this group. The rotarod uses a 1.2 mm-wide plastic rod with the leading edge that is greased. It  is satisfactory for collecting particles down to 10 microns in diameter, which includes some larger mold spores. ?Suction impactors include slit samplers (eg, Burkard spore trap) that draw measured quantities of air across a narrow slit onto a moving drum over a 24-hour or seven-day sampling period. Alternatively, cascade impactors (eg, Andersen sampler) draw air over a large entrance orifice and accelerate it stepwise through plates of increasingly smaller pore or sieve sizes. Increasing the  speed causes smaller particles to be trapped more efficiently. These samplers discriminate between particle sizes and are better at trapping small mold spores than the inertial impactors or gravity samplers. Petri dishes filled with media nutrients can also be placed in the Andersen sampler to directly collect viable spores with different particle sizes for culturing and further analysis. Air sampling methods are unfortunately prone to contamination by collection of molds that may enter the air space but that are not representative of the molds that have caused the environmental contamination of concern. This can occur as a result of open doors or windows that allow contaminating or irrelevant molds into the air space during the sampling period. In addition, different molds are more or less effectively trapped, depending on their size and shape. Moreover, air sampling may sometimes be inexact, because spores can be in an inactive form at the time they are collected, since biologic activity requires humid, warm, nutrient-filled conditions that may not be present at the time of sampling. These inactive spores are not contributing to mold contamination observed in an environment at the time of sampling, but they can become activated when cultured and will be included in the quantified spore count. Analysis methods - Once the specimen has been collected, it can be analyzed by bioassay, biochemical assay, culture, direct  microscopy, nucleic acid analysis, or immunologic assay to identify and quantify the specific molds in the sample. Only a few of these methods are discussed, since a combination of these, often involving purification from a culture, are most widely used to speciate and quantify the mold content of indoor environments where visual inspection has failed to detect a mold infestation. Direct microscopy - Visual identification of mold should be performed by a certified technician who is familiar with the morphologic forms of different fungal spores. Direct microscopic examination is used to quantify the total number of fungal spores on a slide. One limitation of this technique is that it quantifies the total fungal spore burden (both viable and inactive spores), since only a percentage of fungal spores that are counted will be viable and therefore contribute to widespread mold contamination of the environment. Culture - Culture is commonly performed, regardless of the collection method, to expand the spore number so that other laboratory techniques can more readily detect the various molds present. A nutrient medium, with antibiotics such as penicillin and gentamicin to prevent bacterial growth, allows growth of viable spores. The individual colonies are then identified macroscopically, microscopically, biochemically, immunologically, or based on their nucleic acid profile. The number of viable mold spores are then reported at 24 or 48 hours as colony counts or colony-forming units (CFUs) per gram of bulk dust or colonies per cubic meters of air sampled. The culture procedure has technical limitations that can lead to underestimates of the actual spore levels for several reasons: ?A high number of bacteria can occasionally exceed the capacity of the antibiotics in the medium to inhibit growth. This will result in the plated medium becoming covered with bacterial colony clusters, which are morphologically distinct  from mold colony-forming units. A result of this nature will be reported as "high bacterial contamination, mold could not be assessed in this specimen." ?Some spores have highly specialized growth conditions that are not mimicked by the contents of the culture plate. ?Certain spores produce inhibitors that can prevent growth. ?Overcrowding induces soluble inhibition factors, and growth of colonies may diminish as a result of contact suppression. ?The collection process often has a desiccating effect that   reduces the viability of some spores. ?Most spores have limited viability periods when airborne, and these time intervals vary among the different spore types and release times during the day. Nucleic acid analysis - A mold-specific, quantitative polymerase chain reaction (PCR) assay is a laboratory tool for assessing the content of mold in extracts and surface dust. One group used a PCR-based assay to detect the presence and relative levels in house dust of 26 mold species associated with water damage and 10 mold species not associated with water damage. An environmental relative moldiness index (ERMI) was developed using the level of these molds. These 36 molds were measured in the dust from 271 homes of children with asthma. The ability of ERMI level to predict the development of respiratory illness (wheeze and/or rhinitis) was then compared with a binary classification of homes as either moldy or non-moldy, based on onsite inspection [10,11]. The visual onsite home inspection was not predictive of respiratory illness, while the ERMI was able to predict the occurrence of illness in homes containing mold associated with water damage. Immunologic assays - Monoclonal antibody-based immunoenzymometric assays are available to quantify the level of selected individual mold allergens in extracts of surface dust or cultures. "Indicator" allergen assays for Aspergillus fumigatus (Asp f group 1 [Ribotoxin]),  Alternaria alternata (Alt a group 1 allergen [acidic glycoprotein]), and antigens of Aspergillus versicolor and Stachybotrys chartarum can be performed and are sometimes useful. However, the technique has a serious limitation in that nutrient requirements are not always optimal for efficient growth of these molds and expression of their allergenic proteins. Thus, Aspergillus fumigatus or Alternaria alternata in particular may be present in the specimen but their specific allergenic proteins are not expressed and thus undetectable by immunoassay. For this reason, quantitative immunoassay analysis is not performed as a routine clinical test but rather is considered a research tool. PREVENTION AND REMEDIATION - Academic and federal agencies have developed guidelines for homeowners on the issues of mold, moisture, and health in the home [12,13]. Information is also available at the [place] Environmental Protection Agency website. Since mold is ever-present in environments, the absolute level of spore contamination can be used to make decisions about costly remediation of indoor environments. Mold contamination should be expected in cases of water damage. Caution, however, should be exercised when interpreting mold spore or mold-specific allergen levels, as these are rarely quantitative, and they are often generated using samples that may not be representative of the entire environmental area of concern. Direct comparison of levels against outdoor spore counts is also not generally useful. Thus, relative levels of mold spores or allergen levels need to be judged conservatively and where possible within the context of an onsite environmental survey. Reducing humidity by increasing ventilation, covering cold surfaces such as water pipes with insulation, and increasing the air temperature to reduce surface humidity are inexpensive actions that can help discourage mold expansion (table 6). Plumbing leaks and other  sources of dampness or standing water should also be fixed. Visible mold can be remediated using several simple measures (table 6). The least expensive and most effective means of removing surface mold involves scrubbing contaminated nonporous hard surfaces with detergent and water and then drying the area completely. Disinfectants or biocides, such as a diluted chlorine bleach solution (eg, 10 percent), are not usually necessary. Any contaminated areas in which mold has embedded itself, such as a porous wall, floor, carpet, or upholstered area, need to be removed or replaced. A certified industrial hygienist is preferred   if professional remediation is required. SUMMARY AND RECOMMENDATIONS ?Every indoor environment has some mold present, and moisture control is the key to mold growth control. Mold spores need a relative humidity >65 percent, a temperature between 50 to 48F (10 to 32C), and organic matter as their nutrient base to grow. (See 'Properties of molds' above.) ?The initial inspection for indoor mold includes examination for musty odors and visible mold on surfaces. Visible mold can be remediated without further testing or analysis. (See 'Detection of indoor mold growth' above.) ?There are no federal limits for acceptable levels of mold spores in any indoor environment. Thus, mold spore confirmation testing does not provide definitive information for decisions about remediation. (See 'Indications for assessment' above.) ?A professional assessment for mold in the indoor environment is not indicated unless a patient has a confirmed condition known to be related to fungus and there is evidence of exposure to the specific fungus that causes that condition. The indoor environments to which the patient is exposed should be evaluated for growth of the implicated species of fungi if these two criteria are met. Sampling and testing is generally not necessary if visual inspection reveals mold. However,  further assessment is indicated if no mold is seen on visual inspection but a musty odor suggests mold contamination. (See 'Indications for assessment' above and 'Assessment for mold' above.) ?Measures that can be taken to discourage mold growth in the indoor environment include reducing humidity by increasing ventilation, covering cold surfaces such as water pipes with insulation, and increasing the air temperature. Washing nonporous surfaces with detergent and water and drying the area effectively address surface mold contamination. Contaminated porous surface areas in which the mold is embedded must be removed. (See 'Prevention and remediation' above.) (table 6).   ED Prescriptions    None     Controlled Substance Prescriptions [place] Controlled Substance Registry consulted? Not Applicable   Zigmund Gottron, NP 10/30/17 1715

## 2017-10-29 NOTE — ED Triage Notes (Signed)
PT reports he found mold in the air ducts of his home. He has lived there for five months. Reports some SOB at times. No resp. Issues during triage.

## 2017-12-12 ENCOUNTER — Encounter (HOSPITAL_COMMUNITY): Payer: Self-pay

## 2017-12-12 ENCOUNTER — Ambulatory Visit (HOSPITAL_COMMUNITY)
Admission: EM | Admit: 2017-12-12 | Discharge: 2017-12-12 | Disposition: A | Discharge status: Home / Self Care | Payer: Self-pay | Attending: Family Medicine | Admitting: Family Medicine

## 2017-12-12 ENCOUNTER — Other Ambulatory Visit: Payer: Self-pay

## 2017-12-12 DIAGNOSIS — K219 Gastro-esophageal reflux disease without esophagitis: Secondary | ICD-10-CM

## 2017-12-12 MED ORDER — RANITIDINE HCL 300 MG PO TABS: 150.0000 mg | ORAL_TABLET | Freq: Every day | ORAL | 0 refills | Status: DC

## 2017-12-12 MED ORDER — OMEPRAZOLE 20 MG PO CPDR: 20.0000 mg | DELAYED_RELEASE_CAPSULE | Freq: Two times a day (BID) | ORAL | 0 refills | Status: DC

## 2017-12-12 NOTE — ED Provider Notes (Signed)
[facility]   [id number] 12/12/17 Arrival Time: 1358  CC: Acid reflux  SUBJECTIVE:  Franchot MimesWilliam E Josten is a 47 y.o. male hx of H. Pylori who presents with complaint of acid reflux that began this morning after drinking coffee.  Denies a precipitating event, or trauma.  Denies abdominal pain.  Has tried omeprazole without improvement.  Reports worsening symptoms with drinking coffee.  Reports similar symptoms in the past.  Last BM last night and normal for patient.  Reports associated nausea.  Denies fever, chills, appetite changes, weight changes, vomiting, chest pain, SOB, diarrhea, constipation, hematochezia, melena, dysuria, difficulty urinating, increased frequency or urgency, flank pain, loss of bowel or bladder function.  No LMP for male patient.  ROS: As per HPI.  Past Medical History:  Diagnosis Date  . H. pylori infection   . Hypospadias   . Syphilis   . UTI (lower urinary tract infection)    History reviewed. No pertinent surgical history. Allergies  Allergen Reactions  . Septra [Sulfamethoxazole-Trimethoprim] Rash   No current facility-administered medications on file prior to encounter.    Current Outpatient Medications on File Prior to Encounter  Medication Sig Dispense Refill  . ibuprofen (ADVIL,MOTRIN) 600 MG tablet Take 1 tablet (600 mg total) by mouth every 8 (eight) hours as needed. 30 tablet 0   Social History   Socioeconomic History  . Marital status: Single    Spouse name: Not on file  . Number of children: Not on file  . Years of education: Not on file  . Highest education level: Not on file  Occupational History  . Not on file  Social Needs  . Financial resource strain: Not on file  . Food insecurity:    Worry: Not on file    Inability: Not on file  . Transportation needs:    Medical: Not on file    Non-medical: Not on file  Tobacco Use  . Smoking status: Current Every Day Smoker  . Smokeless tobacco: Never Used  Substance and  Sexual Activity  . Alcohol use: Yes  . Drug use: No  . Sexual activity: Yes  Lifestyle  . Physical activity:    Days per week: Not on file    Minutes per session: Not on file  . Stress: Not on file  Relationships  . Social connections:    Talks on phone: Not on file    Gets together: Not on file    Attends religious service: Not on file    Active member of club or organization: Not on file    Attends meetings of clubs or organizations: Not on file    Relationship status: Not on file  . Intimate partner violence:    Fear of current or ex partner: Not on file    Emotionally abused: Not on file    Physically abused: Not on file    Forced sexual activity: Not on file  Other Topics Concern  . Not on file  Social History Narrative  . Not on file   History reviewed. No pertinent family history.   OBJECTIVE:  Vitals:   12/12/17 1412 12/12/17 1413  BP: 131/79   Pulse: 62   Resp: 16   Temp: 98.5 F (36.9 C)   SpO2: 100%   Weight:  200 lb (90.7 kg)    General appearance: AOx3 in no acute distress; nontoxic appearance HEENT: NCAT.  Oropharynx clear.  Lungs: clear to auscultation bilaterally without adventitious breath sounds Heart: regular rate and rhythm.  Radial pulses 2+ symmetrical bilaterally Abdomen: soft, non-distended; normal active bowel sounds; non-tender to light and deep palpation; nontender at McBurney's point; negative Murphy's sign; negative rebound; no guarding Back: no CVA tenderness Extremities: no edema; symmetrical with no gross deformities Skin: warm and dry Neurologic: normal gait Psychological: alert and cooperative; normal mood and affect   ASSESSMENT & PLAN:  1. Gastroesophageal reflux disease, esophagitis presence not specified     Meds ordered this encounter  Medications  . omeprazole (PRILOSEC) 20 MG capsule    Sig: Take 1 capsule (20 mg total) by mouth 2 (two) times daily before a meal.    Dispense:  30 capsule    Refill:  0    Order  Specific Question:   Supervising Provider    Answer:   Isa RankinMURRAY, LAURA WILSON [phone][[id number]]  . ranitidine (ZANTAC) 300 MG tablet    Sig: Take 0.5 tablets (150 mg total) by mouth at bedtime.    Dispense:  30 tablet    Refill:  0    Order Specific Question:   Supervising Provider    Answer:   Isa RankinMURRAY, LAURA WILSON [[id number]]   We will increased the dose of your omeprazole from once daily to twice daily Zantac prescribed.  Take at bedtime daily Avoid eating 2-3 hours before bed Elevate head of bed.  Avoid chocolate, caffeine, alcohol, onion, and mint prior to bed.  This relaxes the bottom part of your esophagus and can make your symptoms worse.  Primary care provider assistance initiated to establish care  Follow up with PCP or [facility] if symptoms persists or do not improve with  Return or go to the ED if you have any new or worsening symptoms  Reviewed expectations re: course of current medical issues. Questions answered. Outlined signs and symptoms indicating need for more acute intervention. Patient verbalized understanding. After Visit Summary given.   Rennis HardingWurst, Duru Reiger, PA-C 12/12/17 1450

## 2017-12-12 NOTE — ED Triage Notes (Signed)
Acid reflux flare this started today.

## 2017-12-12 NOTE — Discharge Instructions (Addendum)
We will increased the dose of your omeprazole from once daily to twice daily Zantac prescribed.  Take at bedtime daily Avoid eating 2-3 hours before bed Elevate head of bed.  Avoid chocolate, caffeine, alcohol, onion, and mint prior to bed.  This relaxes the bottom part of your esophagus and can make your symptoms worse.  Primary care provider assistance initiated to establish care  [facility] if symptoms persists or do not improve with  Return or go to the ED if you have any new or worsening symptoms

## 2018-04-29 ENCOUNTER — Ambulatory Visit (HOSPITAL_COMMUNITY)
Admission: EM | Admit: 2018-04-29 | Discharge: 2018-04-29 | Disposition: A | Discharge status: Home / Self Care | Payer: Commercial Managed Care - PPO | Attending: Internal Medicine | Admitting: Internal Medicine

## 2018-04-29 ENCOUNTER — Encounter (HOSPITAL_COMMUNITY): Payer: Self-pay

## 2018-04-29 ENCOUNTER — Other Ambulatory Visit: Payer: Self-pay

## 2018-04-29 DIAGNOSIS — K219 Gastro-esophageal reflux disease without esophagitis: Secondary | ICD-10-CM | POA: Diagnosis not present

## 2018-04-29 MED ORDER — LIDOCAINE VISCOUS HCL 2 % MT SOLN
OROMUCOSAL | Status: AC
  Filled 2018-04-29: qty 15

## 2018-04-29 MED ORDER — LIDOCAINE VISCOUS HCL 2 % MT SOLN
15.0000 mL | Freq: Once | OROMUCOSAL | Status: AC
  Administered 2018-04-29: 15 mL via ORAL

## 2018-04-29 MED ORDER — OMEPRAZOLE 20 MG PO CPDR: 20.0000 mg | DELAYED_RELEASE_CAPSULE | Freq: Two times a day (BID) | ORAL | 2 refills | Status: DC

## 2018-04-29 MED ORDER — ALUM & MAG HYDROXIDE-SIMETH 200-200-20 MG/5ML PO SUSP
30.0000 mL | Freq: Once | ORAL | Status: AC
  Administered 2018-04-29: 30 mL via ORAL

## 2018-04-29 MED ORDER — ALUM & MAG HYDROXIDE-SIMETH 200-200-20 MG/5ML PO SUSP
ORAL | Status: AC
  Filled 2018-04-29: qty 30

## 2018-04-29 NOTE — ED Provider Notes (Signed)
[facility]    CSN: [id number] Arrival date & time: 04/29/18  1005     History   Chief Complaint Chief Complaint  Patient presents with  . acid reflux    HPI Larry Aguirre is a 48 y.o. male.   HPI Patient is a 48 year old male who presents with chronic GERD symptoms.  His symptoms have been constant over the last 2 days.  He is out of his omeprazole.  Describes the discomfort as burning in the epigastric region.  He has been drinking coffee and eating spicy foods which exacerbate his symptoms.  He did try some Tums over-the-counter which did not help much with his symptoms.  He denies any chest pain, shortness of breath, palpitations.  He denies any cough, congestion, fevers.  No nausea, vomiting, diarrhea.  No blood in stool.  ROS per HPI  Past Medical History:  Diagnosis Date  . H. pylori infection   . Hypospadias   . Syphilis   . UTI (lower urinary tract infection)     There are no active problems to display for this patient.   History reviewed. No pertinent surgical history.     Home Medications    Prior to Admission medications   Medication Sig Start Date End Date Taking? Authorizing Provider  ibuprofen (ADVIL,MOTRIN) 600 MG tablet Take 1 tablet (600 mg total) by mouth every 8 (eight) hours as needed. 08/30/17   Wynetta Fines, MD  omeprazole (PRILOSEC) 20 MG capsule Take 1 capsule (20 mg total) by mouth 2 (two) times daily before a meal. 04/29/18   Admir Candelas A, NP  ranitidine (ZANTAC) 300 MG tablet Take 0.5 tablets (150 mg total) by mouth at bedtime. 12/12/17   Rennis Harding, PA-C    Family History History reviewed. No pertinent family history.  Social History Social History   Tobacco Use  . Smoking status: Current Every Day Smoker  . Smokeless tobacco: Never Used  Substance Use Topics  . Alcohol use: Yes  . Drug use: No     Allergies   Septra [sulfamethoxazole-trimethoprim]   Review of Systems Review of Systems   Physical  Exam Triage Vital Signs ED Triage Vitals  Enc Vitals Group     BP 04/29/18 1107 129/79     Pulse Rate 04/29/18 1107 (!) 58     Resp 04/29/18 1107 16     Temp 04/29/18 1107 98.8 F (37.1 C)     Temp Source 04/29/18 1107 Oral     SpO2 04/29/18 1107 100 %     Weight 04/29/18 1134 220 lb (99.8 kg)     Height --      Head Circumference --      Peak Flow --      Pain Score 04/29/18 1134 10     Pain Loc --      Pain Edu? --      Excl. in GC? --    No data found.  Updated Vital Signs BP 129/79 (BP Location: Left Arm)   Pulse (!) 58   Temp 98.8 F (37.1 C) (Oral)   Resp 16   Wt 220 lb (99.8 kg)   SpO2 100%   BMI 30.68 kg/m   Visual Acuity Right Eye Distance:   Left Eye Distance:   Bilateral Distance:    Right Eye Near:   Left Eye Near:    Bilateral Near:     Physical Exam Vitals signs and nursing note reviewed.  Constitutional:  Appearance: Normal appearance. He is not ill-appearing or toxic-appearing.  HENT:     Head: Normocephalic and atraumatic.     Nose: Nose normal.  Eyes:     Conjunctiva/sclera: Conjunctivae normal.  Neck:     Musculoskeletal: Normal range of motion.  Cardiovascular:     Rate and Rhythm: Normal rate and regular rhythm.     Pulses: Normal pulses.     Heart sounds: Normal heart sounds.  Pulmonary:     Effort: Pulmonary effort is normal.     Breath sounds: Normal breath sounds.  Abdominal:     General: Bowel sounds are normal. There is no distension.     Palpations: Abdomen is soft. There is no mass.     Tenderness: There is no abdominal tenderness. There is no guarding or rebound.     Hernia: No hernia is present.     Comments: Mild tenderness to the epigastric region.   Musculoskeletal: Normal range of motion.  Skin:    General: Skin is warm and dry.  Neurological:     Mental Status: He is alert.  Psychiatric:        Mood and Affect: Mood normal.      UC Treatments / Results  Labs (all labs ordered are listed, but only  abnormal results are displayed) Labs Reviewed - No data to display  EKG None  Radiology No results found.  Procedures Procedures (including critical care time)  Medications Ordered in UC Medications  alum & mag hydroxide-simeth (MAALOX/MYLANTA) 200-200-20 MG/5ML suspension 30 mL (30 mLs Oral Given 04/29/18 1158)    And  lidocaine (XYLOCAINE) 2 % viscous mouth solution 15 mL (15 mLs Oral Given 04/29/18 1158)    Initial Impression / Assessment and Plan / UC Course  I have reviewed the triage vital signs and the nursing notes.  Pertinent labs & imaging results that were available during my care of the patient were reviewed by me and considered in my medical decision making (see chart for details).     GERD We will refill patient's omeprazole as requested GI cocktail given here in clinic There is no concern for any ACS today or other worrisome problems.  Vital signs stable, patient is nontoxic or ill-appearing. Final Clinical Impressions(s) / UC Diagnoses   Final diagnoses:  Gastroesophageal reflux disease without esophagitis     Discharge Instructions     I have refilled your omeprazole We gave you a GI cocktail here in clinic to help with your symptoms Try to avoid spicy, greasy foods, caffeine, chocolate, stress Make sure you elevate the head of the bed and try not to lie flat after eating a meal Follow up as needed for continued or worsening symptoms    ED Prescriptions    Medication Sig Dispense Auth. Provider   omeprazole (PRILOSEC) 20 MG capsule Take 1 capsule (20 mg total) by mouth 2 (two) times daily before a meal. 60 capsule Dahlia ByesBast, Anija Brickner A, NP     Controlled Substance Prescriptions [place] Controlled Substance Registry consulted? no   Janace ArisBast, Jaquavis Felmlee A, NP 04/29/18 2136

## 2018-04-29 NOTE — Discharge Instructions (Signed)
I have refilled your omeprazole We gave you a GI cocktail here in clinic to help with your symptoms Try to avoid spicy, greasy foods, caffeine, chocolate, stress Make sure you elevate the head of the bed and try not to lie flat after eating a meal Follow up as needed for continued or worsening symptoms

## 2018-04-29 NOTE — ED Triage Notes (Signed)
Pt cc he has acid reflux x 2 days.

## 2018-07-28 ENCOUNTER — Emergency Department (HOSPITAL_COMMUNITY)
Admission: EM | Admit: 2018-07-28 | Discharge: 2018-07-28 | Disposition: A | Discharge status: Home / Self Care | Payer: Commercial Managed Care - PPO | Attending: Emergency Medicine | Admitting: Emergency Medicine

## 2018-07-28 ENCOUNTER — Other Ambulatory Visit: Payer: Self-pay

## 2018-07-28 ENCOUNTER — Encounter (HOSPITAL_COMMUNITY): Payer: Self-pay | Admitting: *Deleted

## 2018-07-28 DIAGNOSIS — Y929 Unspecified place or not applicable: Secondary | ICD-10-CM | POA: Diagnosis not present

## 2018-07-28 DIAGNOSIS — Z87891 Personal history of nicotine dependence: Secondary | ICD-10-CM | POA: Insufficient documentation

## 2018-07-28 DIAGNOSIS — Y939 Activity, unspecified: Secondary | ICD-10-CM | POA: Diagnosis not present

## 2018-07-28 DIAGNOSIS — Z79899 Other long term (current) drug therapy: Secondary | ICD-10-CM | POA: Insufficient documentation

## 2018-07-28 DIAGNOSIS — Y999 Unspecified external cause status: Secondary | ICD-10-CM | POA: Diagnosis not present

## 2018-07-28 DIAGNOSIS — S3992XA Unspecified injury of lower back, initial encounter: Secondary | ICD-10-CM | POA: Diagnosis present

## 2018-07-28 DIAGNOSIS — X500XXA Overexertion from strenuous movement or load, initial encounter: Secondary | ICD-10-CM | POA: Insufficient documentation

## 2018-07-28 DIAGNOSIS — S39012A Strain of muscle, fascia and tendon of lower back, initial encounter: Secondary | ICD-10-CM | POA: Diagnosis not present

## 2018-07-28 MED ORDER — CYCLOBENZAPRINE HCL 10 MG PO TABS: 10.0000 mg | ORAL_TABLET | Freq: Two times a day (BID) | ORAL | 0 refills | Status: DC | PRN

## 2018-07-28 NOTE — ED Triage Notes (Signed)
Pt c/o back pain since Friday.  Pt stated "I called out of work Friday because it was hurting so bad."  Pt denies injury/trauma.

## 2018-07-28 NOTE — ED Notes (Signed)
Bed: WLPT2 Expected date:  Expected time:  Means of arrival:  Comments: 

## 2018-07-28 NOTE — ED Provider Notes (Signed)
[facility]EM[facility]ENC[facility]PT Provider Note   CSN: [id number] Arrival date & time: 07/28/18  1923    History   Chief Complaint Chief Complaint  Patient presents with  . Back Pain    HPI Larry Aguirre is a 48 y.o. male.     Patient is a 48 year old male who presents with back pain.  He states he was lifting something at home 2 days ago and started having pain across his lower back.  He felt like it was from the heavy lifting.  It is in the musculature across both sides of the back.  There is no radiation down his legs.  No numbness or weakness to his legs.  No problems controlling his bowel or bladder function.  No fevers.  No associate abdominal pain.  No urinary symptoms.  He has not taken anything at home for the pain other than ibuprofen which did not seem to help.  He called out of work today and needs a work note.  He denies any prior back injuries.     Past Medical History:  Diagnosis Date  . H. pylori infection   . Hypospadias   . Syphilis   . UTI (lower urinary tract infection)     There are no active problems to display for this patient.   History reviewed. No pertinent surgical history.      Home Medications    Prior to Admission medications   Medication Sig Start Date End Date Taking? Authorizing Provider  cyclobenzaprine (FLEXERIL) 10 MG tablet Take 1 tablet (10 mg total) by mouth 2 (two) times daily as needed for muscle spasms. 07/28/18   Rolan Bucco, MD  ibuprofen (ADVIL,MOTRIN) 600 MG tablet Take 1 tablet (600 mg total) by mouth every 8 (eight) hours as needed. 08/30/17   Wynetta Fines, MD  omeprazole (PRILOSEC) 20 MG capsule Take 1 capsule (20 mg total) by mouth 2 (two) times daily before a meal. 04/29/18   Bast, Traci A, NP  ranitidine (ZANTAC) 300 MG tablet Take 0.5 tablets (150 mg total) by mouth at bedtime. 12/12/17   Rennis Harding, PA-C    Family History No family history on file.  Social History Social History   Tobacco  Use  . Smoking status: Former Games developer  . Smokeless tobacco: Never Used  Substance Use Topics  . Alcohol use: Not Currently  . Drug use: No     Allergies   Septra [sulfamethoxazole-trimethoprim]   Review of Systems Review of Systems  Constitutional: Negative for fever.  Gastrointestinal: Negative for abdominal pain, nausea and vomiting.  Genitourinary: Negative for decreased urine volume, difficulty urinating and dysuria.  Musculoskeletal: Positive for back pain. Negative for joint swelling and neck pain.  Skin: Negative for wound.  Neurological: Negative for weakness, numbness and headaches.     Physical Exam Updated Vital Signs BP (!) 147/98 (BP Location: Right Arm)   Pulse (!) 103   Temp 98.7 F (37.1 C) (Oral)   Resp 18   Ht 6' (1.829 m)   Wt 102.1 kg   SpO2 100%   BMI 30.52 kg/m   Physical Exam Constitutional:      Appearance: He is well-developed.  HENT:     Head: Normocephalic and atraumatic.  Neck:     Musculoskeletal: Normal range of motion and neck supple.  Cardiovascular:     Rate and Rhythm: Normal rate.  Pulmonary:     Effort: Pulmonary effort is normal.  Musculoskeletal:  General: Tenderness present.     Comments: Patient has tenderness to the musculature of the lumbar back bilaterally.  There is no spinal tenderness.  Negative straight leg raise bilaterally.  Patellar reflexes symmetric bilaterally.  He has normal sensation and motor function to lower extremities.  Pedal pulses are intact.  Skin:    General: Skin is warm and dry.  Neurological:     Mental Status: He is alert and oriented to person, place, and time.      ED Treatments / Results  Labs (all labs ordered are listed, but only abnormal results are displayed) Labs Reviewed - No data to display  EKG None  Radiology No results found.  Procedures Procedures (including critical care time)  Medications Ordered in ED Medications - No data to display   Initial  Impression / Assessment and Plan / ED Course  I have reviewed the triage vital signs and the nursing notes.  Pertinent labs & imaging results that were available during my care of the patient were reviewed by me and considered in my medical decision making (see chart for details).        Patient presents with pain to his lower back.  Seems to be musculoskeletal in nature.  He has no neurologic deficits.  No associate abdominal pain.  No signs of cauda equina.  His heart rate was 103 on arrival but on my exam it was in the 80s.  He was discharged home in good condition.  He was encouraged to continue ibuprofen and was given a prescription for Flexeril.  He was given a referral to follow-up with orthopedics if his symptoms or not improving.  Return precautions were given.  Final Clinical Impressions(s) / ED Diagnoses   Final diagnoses:  Strain of lumbar region, initial encounter    ED Discharge Orders         Ordered    cyclobenzaprine (FLEXERIL) 10 MG tablet  2 times daily PRN     07/28/18 2000           Rolan Bucco, MD 07/28/18 2008

## 2018-07-30 ENCOUNTER — Encounter (HOSPITAL_COMMUNITY): Payer: Self-pay | Admitting: Emergency Medicine

## 2018-07-30 ENCOUNTER — Emergency Department (HOSPITAL_COMMUNITY)
Admission: EM | Admit: 2018-07-30 | Discharge: 2018-07-30 | Disposition: A | Discharge status: Home / Self Care | Payer: Commercial Managed Care - PPO | Attending: Emergency Medicine | Admitting: Emergency Medicine

## 2018-07-30 ENCOUNTER — Other Ambulatory Visit: Payer: Self-pay

## 2018-07-30 DIAGNOSIS — M545 Low back pain, unspecified: Secondary | ICD-10-CM

## 2018-07-30 DIAGNOSIS — Z87891 Personal history of nicotine dependence: Secondary | ICD-10-CM | POA: Diagnosis not present

## 2018-07-30 DIAGNOSIS — Z79899 Other long term (current) drug therapy: Secondary | ICD-10-CM | POA: Diagnosis not present

## 2018-07-30 MED ORDER — IBUPROFEN 200 MG PO TABS
600.0000 mg | ORAL_TABLET | Freq: Once | ORAL | Status: AC
  Administered 2018-07-30: 16:00:00 600 mg via ORAL
  Filled 2018-07-30: qty 3

## 2018-07-30 NOTE — ED Triage Notes (Signed)
Patient seen on 4/5 for back pain and work note extension. Ambulatory.

## 2018-07-30 NOTE — ED Notes (Signed)
Bed: WLPT4 Expected date:  Expected time:  Means of arrival:  Comments: 

## 2018-07-30 NOTE — ED Provider Notes (Signed)
[facility]-EMERGENCY DEPT Provider Note   CSN: [id number] Arrival date & time: 07/30/18  1509    History   Chief Complaint Chief Complaint  Patient presents with  . Back Pain    HPI Larry Aguirre is a 48 y.o. male with a PMH of H. Pylori infection, hypospadias, syphilis, and UTI presenting with constant non radiating left sided low back pain onset 3 days ago. Patient describes pain as an ache and states it is worse with movement. Patient states pain started after doing squats. Patient states he has taken ibuprofen with partial relief. Patient was evaluate don 04/05 and prescribed a muscle relaxant. Patient states he has not picked up muscle relaxant from the pharmacy yet. Patient reports pain has improved, but he is not able to go to work yet. Patient is requesting a work note extension. Patient states he has an appointment scheduled with orthopedics. Patient denies abdominal pain, urinary symptoms, fever, nausea, vomiting, or diarrhea. Patient denies chest pain, shortness of breath, or headaches. Patient denies numbness, tingling, weakness, incontinence to bowel/bladder, fever, chills, IV drug use, or hx of cancer. Patient states he is ambulating with minimal difficulty.      HPI  Past Medical History:  Diagnosis Date  . H. pylori infection   . Hypospadias   . Syphilis   . UTI (lower urinary tract infection)     There are no active problems to display for this patient.   History reviewed. No pertinent surgical history.      Home Medications    Prior to Admission medications   Medication Sig Start Date End Date Taking? Authorizing Provider  cyclobenzaprine (FLEXERIL) 10 MG tablet Take 1 tablet (10 mg total) by mouth 2 (two) times daily as needed for muscle spasms. 07/28/18   Rolan BuccoBelfi, Melanie, MD  ibuprofen (ADVIL,MOTRIN) 600 MG tablet Take 1 tablet (600 mg total) by mouth every 8 (eight) hours as needed. 08/30/17   Wynetta FinesMessick, Peter C, MD  omeprazole (PRILOSEC)  20 MG capsule Take 1 capsule (20 mg total) by mouth 2 (two) times daily before a meal. 04/29/18   Bast, Traci A, NP  ranitidine (ZANTAC) 300 MG tablet Take 0.5 tablets (150 mg total) by mouth at bedtime. 12/12/17   Rennis HardingWurst, Brittany, PA-C    Family History No family history on file.  Social History Social History   Tobacco Use  . Smoking status: Former Games developermoker  . Smokeless tobacco: Never Used  Substance Use Topics  . Alcohol use: Not Currently  . Drug use: No     Allergies   Septra [sulfamethoxazole-trimethoprim]   Review of Systems Review of Systems  Constitutional: Negative for activity change, chills, diaphoresis, fever and unexpected weight change.  HENT: Negative for congestion and rhinorrhea.   Respiratory: Negative for cough and shortness of breath.   Cardiovascular: Negative for chest pain, palpitations and leg swelling.  Gastrointestinal: Negative for abdominal pain, constipation, diarrhea, nausea and vomiting.  Genitourinary: Negative for difficulty urinating, dysuria, flank pain and hematuria.  Musculoskeletal: Positive for back pain. Negative for arthralgias, gait problem, joint swelling, myalgias, neck pain and neck stiffness.  Skin: Negative for rash.  Allergic/Immunologic: Negative for immunocompromised state.  Neurological: Negative for dizziness, syncope, weakness and numbness.  Hematological: Does not bruise/bleed easily.    Physical Exam Updated Vital Signs BP (!) 136/93 (BP Location: Left Arm)   Pulse 85   Temp 99 F (37.2 C) (Oral)   Resp 16   Ht 6' (1.829 m)   Hartford FinancialWt  102.1 kg   SpO2 100%   BMI 30.52 kg/m   Physical Exam Physical Exam  Constitutional: Pt appears well-developed and well-nourished. No distress. Patient appears agitated because he wants a work note extension. HENT:  Head: Normocephalic and atraumatic.  Mouth/Throat: Oropharynx is clear and moist. No oropharyngeal exudate.  Eyes: Conjunctivae are normal.  Neck: Normal range of motion.  Neck supple. Full ROM without pain. No cervical midline or paraspinal tenderness noted. Cardiovascular: Normal rate, regular rhythm and intact distal pulses.   Pulmonary/Chest: Effort normal and breath sounds normal. No respiratory distress. Pt has no wheezes.  Abdominal: Soft. Pt exhibits no distension. There is no tenderness.  Musculoskeletal:  Full range of motion of the T-spine, and L-spine. No tenderness to palpation of the spinous processes of the T-spine or L-spine. Mild tenderness to palpation of the left paraspinous muscles of the L-spine. Negative straight leg test. Neurological: Pt is alert. Speech is clear and goal oriented, follows commands. Pt has normal reflexes. Reflex Scores:      Patellar reflexes are 2+ on the right side and 2+ on the left side.      Achilles reflexes are 2+ on the right side and 2+ on the left side. Normal 5/5 strength in upper and lower extremities bilaterally including dorsiflexion and plantar flexion, strong and equal grip strength. Sensation is normal to light touch. Moves extremities without ataxia, coordination intact. Normal gait and balance. Skin: Skin is warm and dry. No rash, erythema, edema, or skin changes noted. Pt is not diaphoretic.  Psychiatric: Pt has a normal mood and affect. Behavior is normal.  Nursing note and vitals reviewed.  ED Treatments / Results  Labs (all labs ordered are listed, but only abnormal results are displayed) Labs Reviewed - No data to display  EKG None  Radiology No results found.  Procedures Procedures (including critical care time)  Medications Ordered in ED Medications  ibuprofen (ADVIL,MOTRIN) tablet 600 mg (600 mg Oral Given 07/30/18 1557)     Initial Impression / Assessment and Plan / ED Course  I have reviewed the triage vital signs and the nursing notes.  Pertinent labs & imaging results that were available during my care of the patient were reviewed by me and considered in my medical decision  making (see chart for details).       Patient with back pain.  Suspect symptoms are musculoskeletal in nature due to history and physical exam. No neurological deficits and normal neuro exam.  Patient can walk but states is painful.  No loss of bowel or bladder control.  No concern for cauda equina.  No fever, night sweats, weight loss, h/o cancer, IVDU.  Provided ibuprofen while in the ER. Advised patient to continue ibuprofen and muscle relaxant as previously prescribed. Advised patient to follow up with orthopedics as scheduled. Discussed return precautions. Provided work note extension. RICE protocol indicated and discussed with patient. Patient states he understands and agrees with plan.  Final Clinical Impressions(s) / ED Diagnoses   Final diagnoses:  Acute left-sided low back pain without sciatica    ED Discharge Orders    None       Leretha Dykes, [place] 07/30/18 1602    Arby Barrette, MD 07/31/18 [phone]

## 2018-07-30 NOTE — Discharge Instructions (Signed)
You have been seen today for back pain. Please read and follow all provided instructions.   1. Medications: continue ibuprofen/tylenol for pain, use muscle relaxant as previously prescribed (this medication may make you drowsy, do not drive or operate heavy machinery while taking this medication), usual home medications 2. Treatment: rest, drink plenty of fluids, RICE therapy as instructed. 3. Follow Up: Please follow up with orthopedics as scheduled. Please follow up with your primary doctor in 2-5 days for discussion of your diagnoses and further evaluation after today's visit; if you do not have a primary care doctor use the resource guide provided to find one; Please return to the ER for any new or worsening symptoms. Please obtain all of your results from medical records or have your doctors office obtain the results - share them with your doctor - you should be seen at your doctors office. Call today to arrange your follow up.   Take medications as prescribed. Please review all of the medicines and only take them if you do not have an allergy to them. Return to the emergency room for worsening condition or new concerning symptoms. Follow up with your regular doctor. If you don't have a regular doctor use one of the numbers below to establish a primary care doctor.  Please be aware that if you are taking birth control pills, taking other prescriptions, ESPECIALLY ANTIBIOTICS may make the birth control ineffective - if this is the case, either do not engage in sexual activity or use alternative methods of birth control such as condoms until you have finished the medicine and your family doctor says it is OK to restart them. If you are on a blood thinner such as COUMADIN, be aware that any other medicine that you take may cause the coumadin to either work too much, or not enough - you should have your coumadin level rechecked in next 7 days if this is the case.  ?  It is also a possibility that you have  an allergic reaction to any of the medicines that you have been prescribed - Everybody reacts differently to medications and while MOST people have no trouble with most medicines, you may have a reaction such as nausea, vomiting, rash, swelling, shortness of breath. If this is the case, please stop taking the medicine immediately and contact your physician.  ?  You should return to the ER if you develop severe or worsening symptoms.   Emergency Department Resource Guide 1) Find a Doctor and Pay Out of Pocket Although you won't have to find out who is covered by your insurance plan, it is a good idea to ask around and get recommendations. You will then need to call the office and see if the doctor you have chosen will accept you as a new patient and what types of options they offer for patients who are self-pay. Some doctors offer discounts or will set up payment plans for their patients who do not have insurance, but you will need to ask so you aren't surprised when you get to your appointment.  2) Contact Your Local Health Department Not all health departments have doctors that can see patients for sick visits, but many do, so it is worth a call to see if yours does. If you don't know where your local health department is, you can check in your phone book. The CDC also has a tool to help you locate your state's health department, and many state websites also have listings of all  of their local health departments.  3) Find a Walk-in Clinic If your illness is not likely to be very severe or complicated, you may want to try a walk in clinic. These are popping up all over the country in pharmacies, drugstores, and shopping centers. They're usually staffed by nurse practitioners or physician assistants that have been trained to treat common illnesses and complaints. They're usually fairly quick and inexpensive. However, if you have serious medical issues or chronic medical problems, these are probably not  your best option.  No Primary Care Doctor: Call Health Connect at  [phone] - they can help you locate a primary care doctor that  accepts your insurance, provides certain services, etc. Physician Referral Service- [phone]  Chronic Pain Problems: Organization         Address  Phone   Notes  [facility]  [phone] Patients need to be referred by their primary care doctor.   Medication Assistance: Organization         Address  Phone   Notes  [facility] Medication [facility] [address] [place], [place] [address] [phone] --Must be a resident of [facility] -- Must have NO insurance coverage whatsoever (no Medicaid/ Medicare, etc.) -- The pt. MUST have a primary care doctor that directs their care regularly and follows them in the community   MedAssist  [phone]   [employer]  [phone]    Agencies that provide inexpensive medical care: Organization         Address  Phone   Notes  [facility]  [phone]   [facility]    [phone]   [facility] [address] [place], [place] [address] [phone]   [facility] [address], [place] [phone]   [facility]anned [facility]    [phone]   [facility]    [phone]   [facility]  [address], [place] Phone:  [phone], Fax:  [phone]) [phone] Hours of Operation:  9 am - 6 pm, M-F.  Also accepts Medicaid/Medicare and self-pay.  [facility]  [address], [place] Phone: [phone]) [phone], Fax: [phone]. Hours of Operation:  8:30 am - 5:30 pm, M-F.  Also accepts Medicaid and self-pay.  [facility] [place] [address], [place] Phone: [phone]   [facility] [address], [place], [place] [phone], Ext. [phone] Mondays & Thursdays: 7-9 AM.  First 15  patients are seen on a first come, first serve basis.    Medicaid-accepting  [facility] Providers:  Organization         Address  Phone   Notes  [facility] [address], [place] [phone] Also accepts self-pay patients.  [facility] [address], [place]  [phone]) [phone]   [facility] [address], [place] [phone]   [facility] [address] [place] [address], [place] [phone]) [phone]   Renaye RakersVeita Bland [address], [place]   [phone] Only accepts [place] patients after they have their name applied to their card.   Self-Pay (no insurance) in [facility]:  Organization         Address  Phone   Notes  Sickle Cell Patients,  [facility] [address] [place], [place] [phone]   [facility] [address] [place], [place] [phone]   [facility]  [address], [place] [phone]   [facility]/Dr. Osei-Bonsu  [address], [place] or [address], [place] [phone] Phone number for both [place] and [place] locations is the same.  [facility] [address], [place] [phone]   [facility] [address] [place] or [address] [phone]   [facility] [address], [place] [phone], phone; [phone], fax Sees patients 1st and 3rd Saturday of every month.  Must not qualify for public or private insurance (i.e. Medicaid, Medicare, [place] Health Choice, Veterans' Benefits)  Household income should be no more than 200% of the poverty level The clinic cannot treat you if you are pregnant or think you are pregnant  Sexually transmitted diseases are not treated at the clinic.

## 2018-08-20 ENCOUNTER — Other Ambulatory Visit: Payer: Self-pay

## 2018-08-20 ENCOUNTER — Ambulatory Visit (HOSPITAL_COMMUNITY)
Admission: EM | Admit: 2018-08-20 | Discharge: 2018-08-20 | Disposition: A | Discharge status: Home / Self Care | Payer: Commercial Managed Care - PPO | Attending: Family Medicine | Admitting: Family Medicine

## 2018-08-20 ENCOUNTER — Encounter (HOSPITAL_COMMUNITY): Payer: Self-pay

## 2018-08-20 DIAGNOSIS — Z0289 Encounter for other administrative examinations: Secondary | ICD-10-CM | POA: Diagnosis not present

## 2018-08-20 DIAGNOSIS — R509 Fever, unspecified: Secondary | ICD-10-CM

## 2018-08-20 NOTE — ED Triage Notes (Signed)
Pt states he called out of work because he thought he had a fever, but states he is only worried because someone that works at his employment had covid 19, job is requiring him to have temp check

## 2018-08-20 NOTE — ED Provider Notes (Addendum)
[facility]    CSN: [id number] Arrival date & time: 08/20/18  1129     History   Chief Complaint No chief complaint on file.   HPI Larry Aguirre is a 48 y.o. male no contributing past medical history presenting today for return to work evaluation.  Patient was noted to have a fever of 101 yesterday at work.  Today he has felt normal.  He states that he was feeling slightly warm yesterday, but notes that he works around a lot of hot machines.  He denies any respiratory symptoms of congestion, cough or sore throat.  Has not felt feverish today.  Denies chills or body aches.  Notes that coworker at a different plant has tested positive for COVID-19, but denies close contacts that have been sick or if tested positive.  HPI  Past Medical History:  Diagnosis Date  . H. pylori infection   . Hypospadias   . Syphilis   . UTI (lower urinary tract infection)     There are no active problems to display for this patient.   History reviewed. No pertinent surgical history.     Home Medications    Prior to Admission medications   Medication Sig Start Date End Date Taking? Authorizing Provider  cyclobenzaprine (FLEXERIL) 10 MG tablet Take 1 tablet (10 mg total) by mouth 2 (two) times daily as needed for muscle spasms. 07/28/18   Rolan Bucco, MD  ibuprofen (ADVIL,MOTRIN) 600 MG tablet Take 1 tablet (600 mg total) by mouth every 8 (eight) hours as needed. 08/30/17   Wynetta Fines, MD  omeprazole (PRILOSEC) 20 MG capsule Take 1 capsule (20 mg total) by mouth 2 (two) times daily before a meal. 04/29/18   Bast, Traci A, NP  ranitidine (ZANTAC) 300 MG tablet Take 0.5 tablets (150 mg total) by mouth at bedtime. 12/12/17   Rennis Harding, PA-C    Family History Family History  Family history unknown: Yes    Social History Social History   Tobacco Use  . Smoking status: Former Games developer  . Smokeless tobacco: Never Used  Substance Use Topics  . Alcohol use: Not Currently  .  Drug use: No     Allergies   Septra [sulfamethoxazole-trimethoprim]   Review of Systems Review of Systems  Constitutional: Positive for fever. Negative for activity change, appetite change, chills and fatigue.  HENT: Negative for congestion, ear pain, rhinorrhea, sinus pressure, sore throat and trouble swallowing.   Eyes: Negative for discharge and redness.  Respiratory: Negative for cough, chest tightness and shortness of breath.   Cardiovascular: Negative for chest pain.  Gastrointestinal: Negative for abdominal pain, diarrhea, nausea and vomiting.  Musculoskeletal: Negative for myalgias.  Skin: Negative for rash.  Neurological: Negative for dizziness, light-headedness and headaches.     Physical Exam Triage Vital Signs ED Triage Vitals  Enc Vitals Group     BP 08/20/18 1145 (!) 155/95     Pulse Rate 08/20/18 1145 (!) 58     Resp 08/20/18 1145 (!) 22     Temp 08/20/18 1145 98.6 F (37 C)     Temp src --      SpO2 08/20/18 1145 100 %     Weight --      Height --      Head Circumference --      Peak Flow --      Pain Score 08/20/18 1147 0     Pain Loc --      Pain Edu? --  Excl. in GC? --    No data found.  Updated Vital Signs BP (!) 155/95   Pulse (!) 58   Temp 98.6 F (37 C)   Resp (!) 22   SpO2 100%   Visual Acuity Right Eye Distance:   Left Eye Distance:   Bilateral Distance:    Right Eye Near:   Left Eye Near:    Bilateral Near:     Physical Exam Vitals signs and nursing note reviewed.  Constitutional:      Appearance: He is well-developed.  HENT:     Head: Normocephalic and atraumatic.     Ears:     Comments: Bilateral ears without tenderness to palpation of external auricle, tragus and mastoid, EAC's without erythema or swelling, TM's with good bony landmarks and cone of light. Non erythematous.     Mouth/Throat:     Comments: Oral mucosa pink and moist, no tonsillar enlargement or exudate. Posterior pharynx patent and nonerythematous,  no uvula deviation or swelling. Normal phonation.  Eyes:     Conjunctiva/sclera: Conjunctivae normal.  Neck:     Musculoskeletal: Neck supple.  Cardiovascular:     Rate and Rhythm: Normal rate and regular rhythm.     Heart sounds: No murmur.  Pulmonary:     Effort: Pulmonary effort is normal. No respiratory distress.     Breath sounds: Normal breath sounds.     Comments: Breathing comfortably at rest, CTABL, no wheezing, rales or other adventitious sounds auscultated Abdominal:     Palpations: Abdomen is soft.     Tenderness: There is no abdominal tenderness.  Skin:    General: Skin is warm and dry.  Neurological:     Mental Status: He is alert.      UC Treatments / Results  Labs (all labs ordered are listed, but only abnormal results are displayed) Labs Reviewed - No data to display  EKG None  Radiology No results found.  Procedures Procedures (including critical care time)  Medications Ordered in UC Medications - No data to display  Initial Impression / Assessment and Plan / UC Course  I have reviewed the triage vital signs and the nursing notes.  Pertinent labs & imaging results that were available during my care of the patient were reviewed by me and considered in my medical decision making (see chart for details).     Patient with reported fever yesterday.  Vital signs stable in clinic today, no respiratory symptoms.  Patient feels normal.  No known exposures.  Will advise patient he may return to work on Friday as long as fever does not return to allow 3 full days of fever free.  Advised if he develops any respiratory symptoms he should refrain from work longer.  Unclear cause of fever yesterday at this time.is not currently exhibiting signs/symptoms of COVID.  Discussed strict return precautions. Patient verbalized understanding and is agreeable with plan.  Final Clinical Impressions(s) / UC Diagnoses   Final diagnoses:  Fever, unspecified fever cause   Encounter to obtain excuse from work     Discharge Instructions     You may return to work on Friday as long as your fever does not return and you do not have any cold symptoms of cough, congestion and sore throat, chills or body aches     ED Prescriptions    None     Controlled Substance Prescriptions [place] Controlled Substance Registry consulted? Not Applicable   Lew Dawes, [place] 08/20/18 1215  Lew DawesWieters, Caydon Feasel C, PA-C 08/20/18 1216

## 2018-08-20 NOTE — ED Notes (Signed)
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

## 2018-08-20 NOTE — Discharge Instructions (Addendum)
You may return to work on Friday as long as your fever does not return and you do not have any cold symptoms of cough, congestion and sore throat, chills or body aches

## 2018-12-09 ENCOUNTER — Ambulatory Visit (HOSPITAL_COMMUNITY)
Admission: EM | Admit: 2018-12-09 | Discharge: 2018-12-09 | Disposition: A | Discharge status: Home / Self Care | Payer: Commercial Managed Care - PPO | Attending: Family Medicine | Admitting: Family Medicine

## 2018-12-09 ENCOUNTER — Other Ambulatory Visit: Payer: Self-pay

## 2018-12-09 ENCOUNTER — Encounter (HOSPITAL_COMMUNITY): Payer: Self-pay | Admitting: Emergency Medicine

## 2018-12-09 DIAGNOSIS — K219 Gastro-esophageal reflux disease without esophagitis: Secondary | ICD-10-CM

## 2018-12-09 DIAGNOSIS — Z760 Encounter for issue of repeat prescription: Secondary | ICD-10-CM | POA: Diagnosis not present

## 2018-12-09 MED ORDER — OMEPRAZOLE 20 MG PO CPDR: 20.0000 mg | DELAYED_RELEASE_CAPSULE | Freq: Every day | ORAL | 0 refills | Status: DC

## 2018-12-09 MED ORDER — ALUM & MAG HYDROXIDE-SIMETH 400-400-40 MG/5ML PO SUSP: 10.0000 mL | Freq: Four times a day (QID) | ORAL | 0 refills | Status: DC | PRN

## 2018-12-09 NOTE — Discharge Instructions (Signed)
Begin omeprazole daily  Maalox as needed   Read attached trigger foods, avoid if possible  Follow up if  gastroenterology if continuing- may need screening endoscopy for reflux

## 2018-12-09 NOTE — ED Triage Notes (Signed)
Patient has been out of reflux medicine for 3 months.  This episode has been for a week.

## 2018-12-10 NOTE — ED Provider Notes (Signed)
[facility]    CSN: [id number] Arrival date & time: 12/09/18  1832      History   Chief Complaint Chief Complaint  Patient presents with  . Medication Refill    HPI Larry Aguirre is a 48 y.o. male history of GERD, presenting today for evaluation of reflux and medication refill.  Patient states that he has been out of his reflux medicine, omeprazole.  States that recently he feels his reflux symptoms have worsened.  Occasionally having some abdominal discomfort, has burning sensation in his chest.  Notices his symptoms are triggered often by soda, tomatoes, chocolate and coffee.  He states that he typically has improvement when he is on omeprazole.  He has never had an endoscopy.  Denies shortness of breath.  States that his symptoms feel very similar to when he has had reflux in the past.  Denies tobacco use.  HPI  Past Medical History:  Diagnosis Date  . H. pylori infection   . Hypospadias   . Syphilis   . UTI (lower urinary tract infection)     There are no active problems to display for this patient.   History reviewed. No pertinent surgical history.     Home Medications    Prior to Admission medications   Medication Sig Start Date End Date Taking? Authorizing Provider  calcium carbonate (TUMS - DOSED IN MG ELEMENTAL CALCIUM) 500 MG chewable tablet Chew 1 tablet by mouth daily.   Yes [provider]  alum & mag hydroxide-simeth (MAALOX PLUS) 400-400-40 MG/5ML suspension Take 10 mLs by mouth every 6 (six) hours as needed for indigestion. 12/09/18   Tayli Buch C, PA-C  omeprazole (PRILOSEC) 20 MG capsule Take 1 capsule (20 mg total) by mouth daily. 12/09/18   Fredna Stricker C, PA-C  ranitidine (ZANTAC) 300 MG tablet Take 0.5 tablets (150 mg total) by mouth at bedtime. 12/12/17 12/09/18  Rennis HardingWurst, Brittany, PA-C    Family History Family History  Problem Relation Age of Onset  . Diabetes Mother   . Diabetes Father     Social History Social  History   Tobacco Use  . Smoking status: Former Games developermoker  . Smokeless tobacco: Never Used  Substance Use Topics  . Alcohol use: Not Currently  . Drug use: No     Allergies   Septra [sulfamethoxazole-trimethoprim]   Review of Systems Review of Systems  Constitutional: Negative for activity change, appetite change, chills, fatigue and fever.  HENT: Negative for congestion and trouble swallowing.   Eyes: Negative for discharge and redness.  Respiratory: Negative for cough, chest tightness and shortness of breath.   Cardiovascular: Negative for chest pain.  Gastrointestinal: Positive for abdominal pain. Negative for diarrhea, nausea and vomiting.  Musculoskeletal: Negative for myalgias.  Skin: Negative for rash.  Neurological: Negative for dizziness, light-headedness and headaches.     Physical Exam Triage Vital Signs ED Triage Vitals  Enc Vitals Group     BP 12/09/18 1919 112/74     Pulse Rate 12/09/18 1919 74     Resp 12/09/18 1919 20     Temp 12/09/18 1919 98.3 F (36.8 C)     Temp Source 12/09/18 1919 Temporal     SpO2 12/09/18 1919 100 %     Weight --      Height --      Head Circumference --      Peak Flow --      Pain Score 12/09/18 1916 9     Pain Loc --  Pain Edu? --      Excl. in [place]? --    No data found.  Updated Vital Signs BP 112/74 (BP Location: Left Arm)   Pulse 74   Temp 98.3 F (36.8 C) (Temporal)   Resp 20   SpO2 100%   Visual Acuity Right Eye Distance:   Left Eye Distance:   Bilateral Distance:    Right Eye Near:   Left Eye Near:    Bilateral Near:     Physical Exam Vitals signs and nursing note reviewed.  Constitutional:      Appearance: He is well-developed.     Comments: No acute distress  HENT:     Head: Normocephalic and atraumatic.     Nose: Nose normal.  Eyes:     Conjunctiva/sclera: Conjunctivae normal.  Neck:     Musculoskeletal: Neck supple.  Cardiovascular:     Rate and Rhythm: Normal rate.  Pulmonary:      Effort: Pulmonary effort is normal. No respiratory distress.     Comments: Breathing comfortably at rest, CTABL, no wheezing, rales or other adventitious sounds auscultated  Abdominal:     General: There is no distension.     Comments: Soft, nondistended, nontender to light and deep palpation throughout entire abdomen  Musculoskeletal: Normal range of motion.  Skin:    General: Skin is warm and dry.  Neurological:     Mental Status: He is alert and oriented to person, place, and time.      UC Treatments / Results  Labs (all labs ordered are listed, but only abnormal results are displayed) Labs Reviewed - No data to display  EKG   Radiology No results found.  Procedures Procedures (including critical care time)  Medications Ordered in UC Medications - No data to display  Initial Impression / Assessment and Plan / UC Course  I have reviewed the triage vital signs and the nursing notes.  Pertinent labs & imaging results that were available during my care of the patient were reviewed by me and considered in my medical decision making (see chart for details).     Patient with flaring of his GERD symptoms.  Feels similar to past.  Exam unremarkable, vital signs stable.  Will refill omeprazole, Maalox to use as needed as needed.  Provided information on trigger foods and advised to avoid.  Did discuss following up with gastroenterology as he may need a screening EGD to check for causes of reflux, or any esophageal abnormality. Final Clinical Impressions(s) / UC Diagnoses   Final diagnoses:  Gastroesophageal reflux disease, esophagitis presence not specified  Medication refill     Discharge Instructions     Begin omeprazole daily  Maalox as needed   Read attached trigger foods, avoid if possible  Follow up if  gastroenterology if continuing- may need screening endoscopy for reflux     ED Prescriptions    Medication Sig Dispense Auth. Provider   omeprazole  (PRILOSEC) 20 MG capsule Take 1 capsule (20 mg total) by mouth daily. 60 capsule Harald Quevedo C, PA-C   alum & mag hydroxide-simeth (MAALOX PLUS) 400-400-40 MG/5ML suspension Take 10 mLs by mouth every 6 (six) hours as needed for indigestion. 355 mL Ernesto Zukowski C, PA-C     Controlled Substance Prescriptions [place] Controlled Substance Registry consulted? Not Applicable   Janith Lima, [place] 12/10/18 0830

## 2019-03-04 ENCOUNTER — Encounter (HOSPITAL_COMMUNITY): Payer: Self-pay | Admitting: Emergency Medicine

## 2019-03-04 ENCOUNTER — Ambulatory Visit (HOSPITAL_COMMUNITY)
Admission: EM | Admit: 2019-03-04 | Discharge: 2019-03-04 | Disposition: A | Discharge status: Home / Self Care | Payer: Commercial Managed Care - PPO | Attending: Family Medicine | Admitting: Family Medicine

## 2019-03-04 ENCOUNTER — Other Ambulatory Visit: Payer: Self-pay

## 2019-03-04 DIAGNOSIS — I493 Ventricular premature depolarization: Secondary | ICD-10-CM | POA: Diagnosis not present

## 2019-03-04 DIAGNOSIS — R319 Hematuria, unspecified: Secondary | ICD-10-CM | POA: Diagnosis not present

## 2019-03-04 DIAGNOSIS — R822 Biliuria: Secondary | ICD-10-CM | POA: Insufficient documentation

## 2019-03-04 LAB — POCT URINALYSIS DIP (DEVICE)
Glucose, UA: NEGATIVE mg/dL
Ketones, ur: NEGATIVE mg/dL
Nitrite: POSITIVE — AB
Protein, ur: 100 mg/dL — AB
Specific Gravity, Urine: 1.025 (ref 1.005–1.030)
Urobilinogen, UA: 2 mg/dL — ABNORMAL HIGH (ref 0.0–1.0)
pH: 6 (ref 5.0–8.0)

## 2019-03-04 LAB — HEPATIC FUNCTION PANEL
ALT: 21 U/L (ref 0–44)
AST: 29 U/L (ref 15–41)
Albumin: 4.1 g/dL (ref 3.5–5.0)
Alkaline Phosphatase: 76 U/L (ref 38–126)
Bilirubin, Direct: <0.1 mg/dL (ref 0.0–0.2)
Total Bilirubin: 0.7 mg/dL (ref 0.3–1.2)
Total Protein: 8.4 g/dL — ABNORMAL HIGH (ref 6.5–8.1)

## 2019-03-04 MED ORDER — CEPHALEXIN 500 MG PO CAPS: 500.0000 mg | ORAL_CAPSULE | Freq: Two times a day (BID) | ORAL | 0 refills | Status: AC

## 2019-03-04 NOTE — ED Triage Notes (Addendum)
Noticed changes in urine color today while at work.  Patient says body is aching.  Patient is anxious.  Denies abdominal pain, denies back pain.

## 2019-03-04 NOTE — Discharge Instructions (Signed)
Drink more water Take the antibiotic as directed We are sending the urine for culture I have ordered blood work to check your liver We will notify you of your test results If your tests are abnormal , you may need to see a primary care physician for follow up

## 2019-03-04 NOTE — ED Notes (Signed)
Directed patient to the left side of lobby due to the right side of lobby reserved for patients with Covid symptoms.  Patient upset about seating arrangement.  Explained to patient that we are trying to keep patients from possible exposure.

## 2019-03-04 NOTE — ED Provider Notes (Signed)
[facility]    CSN: [id number] Arrival date & time: 03/04/19  1406      History   Chief Complaint Chief Complaint  Patient presents with  . Hematuria    HPI Larry Aguirre is a 48 y.o. male.   HPI  Patient considers himself healthy.  He is on no medications.  He was at work today and when he went to the restroom he noticed blood in his urine.  He states that this made him feel anxious, and his body started to ache.  He was not having symptoms before he noticed the dark urine.  He thinks the achiness is because he is certain there is something seriously wrong with himself Non-smoker.  He states that he drinks a couple of drinks twice a month.  He is not a daily drinker or heavy drinker.  No history of drinking problems.  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  Denies back pain.  Denies flank pain.  No nausea vomiting.  No history of kidney or bladder problems.  No history of liver problems. States family history is unremarkable. No medicines.  No supplements.  Regular diet.  He states that he does not drink water, he lives off of coffee and Pepsi  Past Medical History:  Diagnosis Date  . H. pylori infection   . Hypospadias   . Syphilis   . UTI (lower urinary tract infection)     There are no active problems to display for this patient.   Past Surgical History:  Procedure Laterality Date  . HERNIA REPAIR         Home Medications    Prior to Admission medications   Medication Sig Start Date End Date Taking? Authorizing Provider  cephALEXin (KEFLEX) 500 MG capsule Take 1 capsule (500 mg total) by mouth 2 (two) times daily for 7 days. 03/04/19 03/11/19  Eustace Moore, MD  omeprazole (PRILOSEC) 20 MG capsule Take 1 capsule (20 mg total) by mouth daily. 12/09/18 03/04/19  Wieters, Hallie C, PA-C  ranitidine (ZANTAC) 300 MG tablet Take 0.5 tablets (150 mg total) by mouth at bedtime. 12/12/17  12/09/18  Rennis Harding, PA-C    Family History Family History  Problem Relation Age of Onset  . Diabetes Mother   . Diabetes Father     Social History Social History   Tobacco Use  . Smoking status: Former Games developer  . Smokeless tobacco: Never Used  Substance Use Topics  . Alcohol use: Not Currently  . Drug use: No     Allergies   Septra [sulfamethoxazole-trimethoprim]   Review of Systems Review of Systems  Constitutional: Negative for chills and fever.  HENT: Negative for ear pain and sore throat.   Eyes: Negative for pain and visual disturbance.  Respiratory: Negative for cough and shortness of breath.   Cardiovascular: Negative for chest pain and palpitations.  Gastrointestinal: Negative for abdominal pain, nausea and vomiting.  Genitourinary: Positive for hematuria. Negative for difficulty urinating, dysuria, flank pain and frequency.  Musculoskeletal: Negative for arthralgias and back pain.  Skin: Negative for color change and rash.  Neurological: Negative for seizures and syncope.  All other systems reviewed and are negative.    Physical Exam Triage Vital Signs ED Triage Vitals  Enc Vitals Group     BP 03/04/19 1513 136/82     Pulse Rate 03/04/19 1513 64     Resp 03/04/19 1513 18  Temp 03/04/19 1513 98.6 F (37 C)     Temp Source 03/04/19 1513 Oral     SpO2 03/04/19 1513 100 %     Weight --      Height --      Head Circumference --      Peak Flow --      Pain Score 03/04/19 1511 1     Pain Loc --      Pain Edu? --      Excl. in [place]? --    No data found.  Updated Vital Signs BP 136/82 (BP Location: Right Arm)   Pulse 64   Temp 98.6 F (37 C) (Oral)   Resp 18   SpO2 100%      Physical Exam Constitutional:      General: He is not in acute distress.    Appearance: He is well-developed.  HENT:     Head: Normocephalic and atraumatic.     Mouth/Throat:     Comments: Mask in place Eyes:     Conjunctiva/sclera: Conjunctivae normal.      Pupils: Pupils are equal, round, and reactive to light.  Neck:     Musculoskeletal: Normal range of motion and neck supple.  Cardiovascular:     Rate and Rhythm: Normal rate.     Heart sounds: Normal heart sounds.  Pulmonary:     Effort: Pulmonary effort is normal. No respiratory distress.     Breath sounds: Normal breath sounds.  Abdominal:     General: There is no distension.     Palpations: Abdomen is soft.     Tenderness: There is no abdominal tenderness. There is no right CVA tenderness or left CVA tenderness.     Comments: No hepatomegaly or tenderness  Musculoskeletal: Normal range of motion.  Skin:    General: Skin is warm and dry.  Neurological:     General: No focal deficit present.     Mental Status: He is alert.  Psychiatric:     Comments: Anxious      UC Treatments / Results  Labs (all labs ordered are listed, but only abnormal results are displayed) Labs Reviewed  POCT URINALYSIS DIP (DEVICE) - Abnormal; Notable for the following components:      Result Value   Bilirubin Urine SMALL (*)    Hgb urine dipstick LARGE (*)    Protein, ur 100 (*)    Urobilinogen, UA 2.0 (*)    Nitrite POSITIVE (*)    Leukocytes,Ua SMALL (*)    All other components within normal limits  URINE CULTURE  HEPATIC FUNCTION PANEL    EKG   Radiology No results found.  Procedures Procedures (including critical care time)  Medications Ordered in UC Medications - No data to display  Initial Impression / Assessment and Plan / UC Course  I have reviewed the triage vital signs and the nursing notes.  Pertinent labs & imaging results that were available during my care of the patient were reviewed by me and considered in my medical decision making (see chart for details).  Clinical Course as of Mar 03 1616  Tue Mar 04, 2019  1533 POCT urinalysis dip (device) [YN]  1533 POCT Urinalysis, Dipstick [YN]  1535 POCT urinalysis dip (device) [YN]  1535 POCT Urinalysis, Dipstick [YN]     Clinical Course User Index [YN] Eustace MooreNelson, Taijah Macrae Sue, MD    We will check liver functions because of the bilirubin.  We will send a culture because of the positive nitrates.  Will treat with Keflex for possible infection.  We will notify patient of his test results. Final Clinical Impressions(s) / UC Diagnoses   Final diagnoses:  Ventricular ectopy  Hematuria, unspecified type  Bilirubin in urine     Discharge Instructions     Drink more water Take the antibiotic as directed We are sending the urine for culture I have ordered blood work to check your liver We will notify you of your test results If your tests are abnormal , you may need to see a primary care physician for follow up    ED Prescriptions    Medication Sig Dispense Auth. Provider   cephALEXin (KEFLEX) 500 MG capsule Take 1 capsule (500 mg total) by mouth 2 (two) times daily for 7 days. 14 capsule Raylene Everts, MD     PDMP not reviewed this encounter.   Raylene Everts, MD 03/04/19 [phone]

## 2019-03-06 LAB — URINE CULTURE: Culture: >=100000 — AB

## 2019-03-07 ENCOUNTER — Telehealth: Payer: Self-pay | Admitting: Emergency Medicine

## 2019-03-07 NOTE — Telephone Encounter (Signed)
Patient contacted and made aware of  Labs and urine culture  results. Pt verbalized understanding and had all questions answered.

## 2019-03-07 NOTE — Telephone Encounter (Signed)
Lab work no significant abnormalities. Urine culture shows e coli and was treated with keflex during visit. Attempted to reach patient to discuss, no answer.

## 2019-03-21 ENCOUNTER — Emergency Department (HOSPITAL_COMMUNITY)
Admission: EM | Admit: 2019-03-21 | Discharge: 2019-03-21 | Disposition: A | Discharge status: Home / Self Care | Payer: Commercial Managed Care - PPO | Attending: Emergency Medicine | Admitting: Emergency Medicine

## 2019-03-21 ENCOUNTER — Other Ambulatory Visit: Payer: Self-pay

## 2019-03-21 ENCOUNTER — Encounter (HOSPITAL_COMMUNITY): Payer: Self-pay | Admitting: Emergency Medicine

## 2019-03-21 DIAGNOSIS — K0889 Other specified disorders of teeth and supporting structures: Secondary | ICD-10-CM | POA: Insufficient documentation

## 2019-03-21 DIAGNOSIS — Z87891 Personal history of nicotine dependence: Secondary | ICD-10-CM | POA: Insufficient documentation

## 2019-03-21 DIAGNOSIS — Z79899 Other long term (current) drug therapy: Secondary | ICD-10-CM | POA: Diagnosis not present

## 2019-03-21 MED ORDER — NAPROXEN 500 MG PO TABS: 500.0000 mg | ORAL_TABLET | Freq: Two times a day (BID) | ORAL | 0 refills | Status: DC

## 2019-03-21 MED ORDER — PENICILLIN V POTASSIUM 500 MG PO TABS: 500.0000 mg | ORAL_TABLET | Freq: Four times a day (QID) | ORAL | 0 refills | Status: AC

## 2019-03-21 NOTE — ED Triage Notes (Signed)
C/o right upper dental pain w/o relief from tylenol. Has appt with dentist 12/2.

## 2019-03-21 NOTE — Discharge Instructions (Addendum)
Rinse with listerine after every meal. Brush with a soft toothbrush. Take Penicillin and complete the full course. Take Naproxen and Tylenol as needed as directed. Follow up with your dentist as scheduled.

## 2019-03-21 NOTE — ED Provider Notes (Signed)
[name] Provider Note   CSN: [id number] Arrival date & time: 03/21/19  0744     History   Chief Complaint Chief Complaint  Patient presents with  . Dental Pain    HPI Larry Aguirre is a 48 y.o. male.     48 year old male with right upper dental pain x3 weeks.  Denies trauma, fever, drainage.  Patient has an appointment scheduled for December 2 to see a dentist, is taking Tylenol without relief of his pain and hopes to see a dentist today.  No other complaints or concerns.     Past Medical History:  Diagnosis Date  . H. pylori infection   . Hypospadias   . Syphilis   . UTI (lower urinary tract infection)     There are no active problems to display for this patient.   Past Surgical History:  Procedure Laterality Date  . HERNIA REPAIR          Home Medications    Prior to Admission medications   Medication Sig Start Date End Date Taking? Authorizing Provider  naproxen (NAPROSYN) 500 MG tablet Take 1 tablet (500 mg total) by mouth 2 (two) times daily. 03/21/19   Tacy Learn, PA-C  penicillin v potassium (VEETID) 500 MG tablet Take 1 tablet (500 mg total) by mouth 4 (four) times daily for 10 days. 03/21/19 03/31/19  Tacy Learn, PA-C  omeprazole (PRILOSEC) 20 MG capsule Take 1 capsule (20 mg total) by mouth daily. 12/09/18 03/04/19  Wieters, Hallie C, PA-C  ranitidine (ZANTAC) 300 MG tablet Take 0.5 tablets (150 mg total) by mouth at bedtime. 12/12/17 12/09/18  Lestine Box, PA-C    Family History Family History  Problem Relation Age of Onset  . Diabetes Mother   . Diabetes Father     Social History Social History   Tobacco Use  . Smoking status: Former Research scientist (life sciences)  . Smokeless tobacco: Never Used  Substance Use Topics  . Alcohol use: Not Currently  . Drug use: No     Allergies   Septra [sulfamethoxazole-trimethoprim]   Review of Systems Review of Systems  Constitutional: Negative for fever.   HENT: Positive for dental problem. Negative for ear pain, facial swelling, trouble swallowing and voice change.   Gastrointestinal: Negative for nausea and vomiting.  Musculoskeletal: Negative for neck pain.  Skin: Negative for rash and wound.  Allergic/Immunologic: Negative for immunocompromised state.  Neurological: Negative for headaches.  Hematological: Negative for adenopathy.  Psychiatric/Behavioral: Negative for confusion.  All other systems reviewed and are negative.    Physical Exam Updated Vital Signs BP (!) 139/99 (BP Location: Right Arm)   Pulse 60   Temp 98.5 F (36.9 C) (Oral)   Resp 17   Ht 6\' 1"  (1.854 m)   Wt 102.1 kg   SpO2 99%   BMI 29.69 kg/m   Physical Exam Vitals signs and nursing note reviewed.  Constitutional:      General: He is not in acute distress.    Appearance: He is well-developed. He is not diaphoretic.  HENT:     Head: Normocephalic and atraumatic.     Jaw: No trismus.     Mouth/Throat:     Dentition: Abnormal dentition. Dental tenderness and dental caries present. No gingival swelling, dental abscesses or gum lesions.   Neck:     Musculoskeletal: Neck supple.  Pulmonary:     Effort: Pulmonary effort is normal.  Lymphadenopathy:     Cervical: No cervical adenopathy.  Neurological:     Mental Status: He is alert and oriented to person, place, and time.  Psychiatric:        Behavior: Behavior normal.      ED Treatments / Results  Labs (all labs ordered are listed, but only abnormal results are displayed) Labs Reviewed - No data to display  EKG None  Radiology No results found.  Procedures Procedures (including critical care time)  Medications Ordered in ED Medications - No data to display   Initial Impression / Assessment and Plan / ED Course  I have reviewed the triage vital signs and the nursing notes.  Pertinent labs & imaging results that were available during my care of the patient were reviewed by me and  considered in my medical decision making (see chart for details).  Clinical Course as of Mar 20 818  Fri Mar 21, 2019  [id number] 48 year old male with right upper dental pain, no obvious abscess, does have large cavity present.  Will start on penicillin, improved with naproxen.  Follow-up with dentist as scheduled.   [LM]    Clinical Course User Index [LM] Jeannie Fend, PA-C      Final Clinical Impressions(s) / ED Diagnoses   Final diagnoses:  Pain, dental    ED Discharge Orders         Ordered    penicillin v potassium (VEETID) 500 MG tablet  4 times daily     03/21/19 0806    naproxen (NAPROSYN) 500 MG tablet  2 times daily     03/21/19 0806           Jeannie Fend, PA-C 03/21/19 5625    Cathren Laine, MD 03/21/19 [phone]

## 2019-05-23 ENCOUNTER — Other Ambulatory Visit: Payer: Self-pay

## 2019-05-23 ENCOUNTER — Ambulatory Visit (HOSPITAL_COMMUNITY)
Admission: EM | Admit: 2019-05-23 | Discharge: 2019-05-23 | Disposition: A | Discharge status: Home / Self Care | Payer: Commercial Managed Care - PPO | Attending: Physician Assistant | Admitting: Physician Assistant

## 2019-05-23 ENCOUNTER — Encounter (HOSPITAL_COMMUNITY): Payer: Self-pay | Admitting: Emergency Medicine

## 2019-05-23 DIAGNOSIS — Z791 Long term (current) use of non-steroidal anti-inflammatories (NSAID): Secondary | ICD-10-CM | POA: Insufficient documentation

## 2019-05-23 DIAGNOSIS — R110 Nausea: Secondary | ICD-10-CM | POA: Insufficient documentation

## 2019-05-23 DIAGNOSIS — Z79899 Other long term (current) drug therapy: Secondary | ICD-10-CM | POA: Insufficient documentation

## 2019-05-23 DIAGNOSIS — Z87891 Personal history of nicotine dependence: Secondary | ICD-10-CM | POA: Insufficient documentation

## 2019-05-23 DIAGNOSIS — Z20822 Contact with and (suspected) exposure to covid-19: Secondary | ICD-10-CM | POA: Diagnosis not present

## 2019-05-23 DIAGNOSIS — K219 Gastro-esophageal reflux disease without esophagitis: Secondary | ICD-10-CM | POA: Diagnosis present

## 2019-05-23 DIAGNOSIS — Z833 Family history of diabetes mellitus: Secondary | ICD-10-CM | POA: Insufficient documentation

## 2019-05-23 MED ORDER — FAMOTIDINE 20 MG PO TABS: 20.0000 mg | ORAL_TABLET | Freq: Two times a day (BID) | ORAL | 0 refills | Status: DC

## 2019-05-23 NOTE — Discharge Instructions (Addendum)
Take the PEPCID 2 times a day, if this does not help then begin taking over the counter prilosec, 20mg  once daily.   Elevate the head of your bed at night. Do not consume meals directly before bed time.  if your Covid-19 test is positive, you will receive a phone call from [facility] regarding your results. Negative test results are not called. Both positive and negative results area always visible on MyChart. If you do not have a MyChart account, sign up instructions are in your discharge papers.   Persons who are directed to care for themselves at home may discontinue isolation under the following conditions:   At least 10 days have passed since symptom onset and  At least 24 hours have passed without running a fever (this means without the use of fever-reducing medications) and  Other symptoms have improved.  Persons infected with COVID-19 who never develop symptoms may discontinue isolation and other precautions 10 days after the date of their first positive COVID-19 test.

## 2019-05-23 NOTE — ED Provider Notes (Signed)
[facility]    CSN: [id number] Arrival date & time: 05/23/19  1621      History   Chief Complaint Chief Complaint  Patient presents with  . Fever    HPI Larry Aguirre is a 49 y.o. male.   Patient reports to urgent care today for abdominal discomfort and to be evaluated after having a temperature at work. He has a history of reflux and states this feels that way now. He states he told his job "he wasn't feeling well" and they checked his temperature and it was 100. He said he would be evaluated. He reports burning in his stomach and in his chest. He also reports episodes of bloating. He believes his symptoms are related to his diet. He reports upset stomach as well. He denies vomiting, diarrhea, headache, cough, sore throat.   He has been treated for GERD previously.   On further conversation and clarification, he will need COVID testing before he may return to work.      Past Medical History:  Diagnosis Date  . H. pylori infection   . Hypospadias   . Syphilis   . UTI (lower urinary tract infection)     There are no problems to display for this patient.   Past Surgical History:  Procedure Laterality Date  . HERNIA REPAIR         Home Medications    Prior to Admission medications   Medication Sig Start Date End Date Taking? Authorizing Provider  famotidine (PEPCID) 20 MG tablet Take 1 tablet (20 mg total) by mouth 2 (two) times daily. 05/23/19   Cutberto Winfree, Veryl Speak, PA-C  naproxen (NAPROSYN) 500 MG tablet Take 1 tablet (500 mg total) by mouth 2 (two) times daily. 03/21/19   Jeannie Fend, PA-C  omeprazole (PRILOSEC) 20 MG capsule Take 1 capsule (20 mg total) by mouth daily. 12/09/18 03/04/19  Wieters, Hallie C, PA-C  ranitidine (ZANTAC) 300 MG tablet Take 0.5 tablets (150 mg total) by mouth at bedtime. 12/12/17 12/09/18  Rennis Harding, PA-C    Family History Family History  Problem Relation Age of Onset  . Diabetes Mother   . Diabetes Father      Social History Social History   Tobacco Use  . Smoking status: Former Games developer  . Smokeless tobacco: Never Used  Substance Use Topics  . Alcohol use: Not Currently  . Drug use: No     Allergies   Septra [sulfamethoxazole-trimethoprim]   Review of Systems Review of Systems  Constitutional: Positive for fever. Negative for chills.  HENT: Negative for congestion, ear pain, rhinorrhea, sinus pressure, sinus pain and sore throat.   Eyes: Negative for pain and visual disturbance.  Respiratory: Negative for cough and shortness of breath.   Cardiovascular: Negative for chest pain and palpitations.  Gastrointestinal: Positive for abdominal distention, abdominal pain and nausea. Negative for diarrhea and vomiting.  Genitourinary: Negative for dysuria and hematuria.  Musculoskeletal: Negative for arthralgias, back pain and myalgias.  Skin: Negative for color change and rash.  Neurological: Negative for seizures, syncope and headaches.  All other systems reviewed and are negative.    Physical Exam Triage Vital Signs ED Triage Vitals  Enc Vitals Group     BP 05/23/19 1649 111/84     Pulse Rate 05/23/19 1649 60     Resp 05/23/19 1649 18     Temp 05/23/19 1649 98.6 F (37 C)     Temp Source 05/23/19 1649 Oral     SpO2  05/23/19 1649 100 %     Weight --      Height --      Head Circumference --      Peak Flow --      Pain Score 05/23/19 1653 0     Pain Loc --      Pain Edu? --      Excl. in [place]? --    No data found.  Updated Vital Signs BP 111/84 (BP Location: Left Arm)   Pulse 60   Temp 98.6 F (37 C) (Oral)   Resp 18   SpO2 100%   Visual Acuity Right Eye Distance:   Left Eye Distance:   Bilateral Distance:    Right Eye Near:   Left Eye Near:    Bilateral Near:     Physical Exam Vitals and nursing note reviewed.  Constitutional:      General: He is not in acute distress.    Appearance: Normal appearance. He is well-developed. He is not ill-appearing.   HENT:     Head: Normocephalic and atraumatic.  Eyes:     General: No scleral icterus.    Conjunctiva/sclera: Conjunctivae normal.     Pupils: Pupils are equal, round, and reactive to light.  Cardiovascular:     Rate and Rhythm: Normal rate and regular rhythm.     Heart sounds: No murmur.  Pulmonary:     Effort: Pulmonary effort is normal. No respiratory distress.     Breath sounds: Normal breath sounds. No wheezing or rales.  Abdominal:     General: Abdomen is flat.     Palpations: Abdomen is soft.     Tenderness: There is no abdominal tenderness.  Musculoskeletal:     Cervical back: Neck supple.     Right lower leg: No edema.     Left lower leg: No edema.  Skin:    General: Skin is warm and dry.  Neurological:     General: No focal deficit present.     Mental Status: He is alert and oriented to person, place, and time.  Psychiatric:        Mood and Affect: Mood normal.        Behavior: Behavior normal.        Thought Content: Thought content normal.        Judgment: Judgment normal.      UC Treatments / Results  Labs (all labs ordered are listed, but only abnormal results are displayed) Labs Reviewed  NOVEL CORONAVIRUS, NAA (HOSP ORDER, SEND-OUT TO REF LAB; TAT 18-24 HRS)    EKG   Radiology No results found.  Procedures Procedures (including critical care time)  Medications Ordered in UC Medications - No data to display  Initial Impression / Assessment and Plan / UC Course  I have reviewed the triage vital signs and the nursing notes.  Pertinent labs & imaging results that were available during my care of the patient were reviewed by me and considered in my medical decision making (see chart for details).     #GERD - Believe symptoms to be related to history of GERD. Can not rule out COVID due to history of fever, PCR sent. Famotidine trial with instructions to begin prilosec if this is ineffective. PCP assistance sent and discussed importance of  establishing with a PCP for health maintenance and discussed that Reflux is not a benign condition and can have long term issues.     Final Clinical Impressions(s) / UC Diagnoses   Final  diagnoses:  Nausea without vomiting  Gastroesophageal reflux disease without esophagitis     Discharge Instructions     Take the PEPCID 2 times a day, if this does not help then begin taking over the counter prilosec, 20mg  once daily.   Elevate the head of your bed at night. Do not consume meals directly before bed time.  if your Covid-19 test is positive, you will receive a phone call from [facility] regarding your results. Negative test results are not called. Both positive and negative results area always visible on MyChart. If you do not have a MyChart account, sign up instructions are in your discharge papers.   Persons who are directed to care for themselves at home may discontinue isolation under the following conditions:  . At least 10 days have passed since symptom onset and . At least 24 hours have passed without running a fever (this means without the use of fever-reducing medications) and . Other symptoms have improved.  Persons infected with COVID-19 who never develop symptoms may discontinue isolation and other precautions 10 days after the date of their first positive COVID-19 test.      ED Prescriptions    Medication Sig Dispense Auth. Provider   famotidine (PEPCID) 20 MG tablet Take 1 tablet (20 mg total) by mouth 2 (two) times daily. 30 tablet Vonnie Spagnolo, [facility], PA-C     PDMP not reviewed this encounter.   Veryl Speak, PA-C 05/23/19 1737

## 2019-05-23 NOTE — ED Triage Notes (Addendum)
Pt is here because his job told him to come.  He is not very forthcoming with information about his fever at work.  He is adamant that he has MS and acid reflux and that is why he had a fever at work.  Pt is upset about having to be tested and having to miss work.  After speaking further, pt states his stomach was bothering him, as he says it does a lot from GERD from his MS.  His work decided to check him out and took his temperature and he said it was around 100, but he does not know how much over 100.  Pt states he is completely fine and has no symptoms at all, except for his GERD.

## 2019-05-25 LAB — NOVEL CORONAVIRUS, NAA (HOSP ORDER, SEND-OUT TO REF LAB; TAT 18-24 HRS): SARS-CoV-2, NAA: NOT DETECTED

## 2019-05-26 ENCOUNTER — Telehealth: Payer: Self-pay | Admitting: General Practice

## 2019-05-26 NOTE — Telephone Encounter (Signed)
Negative COVID results given. Patient results "NOT Detected." Caller expressed understanding. ° °

## 2019-09-29 ENCOUNTER — Other Ambulatory Visit: Payer: Self-pay

## 2019-09-29 ENCOUNTER — Encounter (HOSPITAL_COMMUNITY): Payer: Self-pay | Admitting: Emergency Medicine

## 2019-09-29 ENCOUNTER — Ambulatory Visit (HOSPITAL_COMMUNITY)
Admission: EM | Admit: 2019-09-29 | Discharge: 2019-09-29 | Disposition: A | Discharge status: Home / Self Care | Payer: HRSA Program | Attending: Family Medicine | Admitting: Family Medicine

## 2019-09-29 DIAGNOSIS — Z20822 Contact with and (suspected) exposure to covid-19: Secondary | ICD-10-CM | POA: Insufficient documentation

## 2019-09-29 NOTE — ED Triage Notes (Signed)
Pt presents today after covid exposure 10 days ago. No symptoms. Requesting Covid testing to return to work.

## 2019-09-29 NOTE — Discharge Instructions (Signed)
Monitor myChart for results 

## 2019-09-30 LAB — SARS CORONAVIRUS 2 (TAT 6-24 HRS): SARS Coronavirus 2: NEGATIVE

## 2019-09-30 NOTE — ED Provider Notes (Signed)
[place]    CSN: [id number] Arrival date & time: 09/29/19  1753      History   Chief Complaint Chief Complaint  Patient presents with  . Covid Exposure    HPI Larry Aguirre is a 49 y.o. male presenting today for Covid testing.  Patient had Covid exposure at home approximately 10 days ago.  Reports that his daughter and other household members have had Covid.  He has felt normal.  Denies URI symptoms.  Denies GI symptoms.  Denies fevers chills or body aches.  Denies loss of taste smell or appetite.  Energy level has been maintained.  Has been quarantining and needing test to return to work.  HPI  Past Medical History:  Diagnosis Date  . H. pylori infection   . Hypospadias   . Syphilis   . UTI (lower urinary tract infection)     There are no problems to display for this patient.   Past Surgical History:  Procedure Laterality Date  . HERNIA REPAIR         Home Medications    Prior to Admission medications   Medication Sig Start Date End Date Taking? Authorizing Provider  famotidine (PEPCID) 20 MG tablet Take 1 tablet (20 mg total) by mouth 2 (two) times daily. 05/23/19   Darr, Marguerita Beards, PA-C  naproxen (NAPROSYN) 500 MG tablet Take 1 tablet (500 mg total) by mouth 2 (two) times daily. 03/21/19   Tacy Learn, PA-C  omeprazole (PRILOSEC) 20 MG capsule Take 1 capsule (20 mg total) by mouth daily. 12/09/18 03/04/19  Kanoa Phillippi C, PA-C  ranitidine (ZANTAC) 300 MG tablet Take 0.5 tablets (150 mg total) by mouth at bedtime. 12/12/17 12/09/18  Lestine Box, PA-C    Family History Family History  Problem Relation Age of Onset  . Diabetes Mother   . Diabetes Father     Social History Social History   Tobacco Use  . Smoking status: Former Research scientist (life sciences)  . Smokeless tobacco: Never Used  Substance Use Topics  . Alcohol use: Not Currently  . Drug use: No     Allergies   Septra [sulfamethoxazole-trimethoprim]   Review of Systems Review of Systems    Constitutional: Negative for activity change, appetite change, chills, fatigue and fever.  HENT: Negative for congestion, ear pain, rhinorrhea, sinus pressure, sore throat and trouble swallowing.   Eyes: Negative for discharge and redness.  Respiratory: Negative for cough, chest tightness and shortness of breath.   Cardiovascular: Negative for chest pain.  Gastrointestinal: Negative for abdominal pain, diarrhea, nausea and vomiting.  Musculoskeletal: Negative for myalgias.  Skin: Negative for rash.  Neurological: Negative for dizziness, light-headedness and headaches.     Physical Exam Triage Vital Signs ED Triage Vitals  Enc Vitals Group     BP 09/29/19 1926 135/74     Pulse Rate 09/29/19 1926 (!) 56     Resp --      Temp 09/29/19 1926 98.2 F (36.8 C)     Temp Source 09/29/19 1926 Oral     SpO2 09/29/19 1926 98 %     Weight --      Height --      Head Circumference --      Peak Flow --      Pain Score 09/29/19 1927 0     Pain Loc --      Pain Edu? --      Excl. in [place]? --    No data found.  Updated  Vital Signs BP 135/74 (BP Location: Right Arm)   Pulse (!) 56   Temp 98.2 F (36.8 C) (Oral)   SpO2 98%   Visual Acuity Right Eye Distance:   Left Eye Distance:   Bilateral Distance:    Right Eye Near:   Left Eye Near:    Bilateral Near:     Physical Exam Vitals and nursing note reviewed.  Constitutional:      Appearance: He is well-developed.     Comments: No acute distress  HENT:     Head: Normocephalic and atraumatic.     Nose: Nose normal.  Eyes:     Conjunctiva/sclera: Conjunctivae normal.  Cardiovascular:     Rate and Rhythm: Normal rate.  Pulmonary:     Effort: Pulmonary effort is normal. No respiratory distress.  Abdominal:     General: There is no distension.  Musculoskeletal:        General: Normal range of motion.     Cervical back: Neck supple.  Skin:    General: Skin is warm and dry.  Neurological:     Mental Status: He is alert and  oriented to person, place, and time.      UC Treatments / Results  Labs (all labs ordered are listed, but only abnormal results are displayed) Labs Reviewed  SARS CORONAVIRUS 2 (TAT 6-24 HRS)    EKG   Radiology No results found.  Procedures Procedures (including critical care time)  Medications Ordered in UC Medications - No data to display  Initial Impression / Assessment and Plan / UC Course  I have reviewed the triage vital signs and the nursing notes.  Pertinent labs & imaging results that were available during my care of the patient were reviewed by me and considered in my medical decision making (see chart for details).     Covid PCR pending.  Currently asymptomatic.  May return to work pending negative test.  Discussed strict return precautions. Patient verbalized understanding and is agreeable with plan.  Final Clinical Impressions(s) / UC Diagnoses   Final diagnoses:  Encounter for laboratory testing for COVID-19 virus  Exposure to COVID-19 virus     Discharge Instructions     Monitor my Chart for results   ED Prescriptions    None     PDMP not reviewed this encounter.   Charan Prieto, [place], PA-C 09/30/19 0830

## 2019-10-03 ENCOUNTER — Emergency Department (HOSPITAL_COMMUNITY)
Admission: EM | Admit: 2019-10-03 | Discharge: 2019-10-04 | Disposition: A | Discharge status: Home / Self Care | Payer: HRSA Program | Attending: Emergency Medicine | Admitting: Emergency Medicine

## 2019-10-03 ENCOUNTER — Other Ambulatory Visit: Payer: Self-pay

## 2019-10-03 ENCOUNTER — Encounter (HOSPITAL_COMMUNITY): Payer: Self-pay | Admitting: Emergency Medicine

## 2019-10-03 DIAGNOSIS — Z79899 Other long term (current) drug therapy: Secondary | ICD-10-CM | POA: Insufficient documentation

## 2019-10-03 DIAGNOSIS — N3001 Acute cystitis with hematuria: Secondary | ICD-10-CM | POA: Diagnosis not present

## 2019-10-03 DIAGNOSIS — Z87891 Personal history of nicotine dependence: Secondary | ICD-10-CM | POA: Insufficient documentation

## 2019-10-03 DIAGNOSIS — R5383 Other fatigue: Secondary | ICD-10-CM | POA: Diagnosis present

## 2019-10-03 DIAGNOSIS — U071 COVID-19: Secondary | ICD-10-CM

## 2019-10-03 LAB — CBC WITH DIFFERENTIAL/PLATELET
Abs Immature Granulocytes: 0.02 10*3/uL (ref 0.00–0.07)
Basophils Absolute: 0 10*3/uL (ref 0.0–0.1)
Basophils Relative: 1 %
Eosinophils Absolute: 0.1 10*3/uL (ref 0.0–0.5)
Eosinophils Relative: 1 %
HCT: 43.5 % (ref 39.0–52.0)
Hemoglobin: 13.9 g/dL (ref 13.0–17.0)
Immature Granulocytes: 0 %
Lymphocytes Relative: 45 %
Lymphs Abs: 2.3 10*3/uL (ref 0.7–4.0)
MCH: 28.8 pg (ref 26.0–34.0)
MCHC: 32 g/dL (ref 30.0–36.0)
MCV: 90.2 fL (ref 80.0–100.0)
Monocytes Absolute: 0.8 10*3/uL (ref 0.1–1.0)
Monocytes Relative: 14 %
Neutro Abs: 2.1 10*3/uL (ref 1.7–7.7)
Neutrophils Relative %: 39 %
Platelets: 180 10*3/uL (ref 150–400)
RBC: 4.82 MIL/uL (ref 4.22–5.81)
RDW: 13.2 % (ref 11.5–15.5)
WBC: 5.3 10*3/uL (ref 4.0–10.5)
nRBC: 0 % (ref 0.0–0.2)

## 2019-10-03 LAB — COMPREHENSIVE METABOLIC PANEL
ALT: 22 U/L (ref 0–44)
AST: 33 U/L (ref 15–41)
Albumin: 3.6 g/dL (ref 3.5–5.0)
Alkaline Phosphatase: 61 U/L (ref 38–126)
Anion gap: 5 (ref 5–15)
BUN: 6 mg/dL (ref 6–20)
CO2: 27 mmol/L (ref 22–32)
Calcium: 8.7 mg/dL — ABNORMAL LOW (ref 8.9–10.3)
Chloride: 103 mmol/L (ref 98–111)
Creatinine, Ser: 0.96 mg/dL (ref 0.61–1.24)
GFR calc Af Amer: >60 mL/min (ref >60)
GFR calc non Af Amer: >60 mL/min (ref >60)
Glucose, Bld: 95 mg/dL (ref 70–99)
Potassium: 3.7 mmol/L (ref 3.5–5.1)
Sodium: 135 mmol/L (ref 135–145)
Total Bilirubin: 0.6 mg/dL (ref 0.3–1.2)
Total Protein: 8 g/dL (ref 6.5–8.1)

## 2019-10-03 NOTE — ED Triage Notes (Signed)
Patient reports feeling dehydrated with fatigue and generalized weakness this week , no emesis or diarrhea , denies fever or chills .

## 2019-10-04 ENCOUNTER — Encounter (HOSPITAL_COMMUNITY): Payer: Self-pay | Admitting: Student

## 2019-10-04 ENCOUNTER — Emergency Department (HOSPITAL_COMMUNITY): Payer: HRSA Program

## 2019-10-04 LAB — SARS CORONAVIRUS 2 BY RT PCR (HOSPITAL ORDER, PERFORMED IN ~~LOC~~ HOSPITAL LAB): SARS Coronavirus 2: POSITIVE — AB

## 2019-10-04 LAB — URINALYSIS, ROUTINE W REFLEX MICROSCOPIC
Bilirubin Urine: NEGATIVE
Glucose, UA: NEGATIVE mg/dL
Ketones, ur: 5 mg/dL — AB
Nitrite: POSITIVE — AB
Protein, ur: 30 mg/dL — AB
Specific Gravity, Urine: 1.027 (ref 1.005–1.030)
WBC, UA: >50 WBC/hpf — ABNORMAL HIGH (ref 0–5)
pH: 5 (ref 5.0–8.0)

## 2019-10-04 MED ORDER — SODIUM CHLORIDE 0.9 % IV BOLUS
1000.0000 mL | Freq: Once | INTRAVENOUS | Status: AC
  Administered 2019-10-04: 1000 mL via INTRAVENOUS

## 2019-10-04 MED ORDER — BENZONATATE 100 MG PO CAPS: 100.0000 mg | ORAL_CAPSULE | Freq: Three times a day (TID) | ORAL | 0 refills | Status: DC

## 2019-10-04 MED ORDER — CEFPODOXIME PROXETIL 200 MG PO TABS: 200.0000 mg | ORAL_TABLET | Freq: Two times a day (BID) | ORAL | 0 refills | Status: DC

## 2019-10-04 NOTE — ED Notes (Signed)
Called pt multiple times  Pt said he was going get food and never returned

## 2019-10-04 NOTE — ED Provider Notes (Signed)
[facility] EMERGENCY DEPARTMENT Provider Note   CSN: [id number] Arrival date & time: 10/03/19  1846     History Chief Complaint  Patient presents with  . Feels Dehydrated/Fatigue    Larry Aguirre is a 49 y.o. male with a history of syphilis & prior UTIs who presents to the ED with complaints of fatigue x 1 week. Patient states he feels generally weak/fatigued, thinks he is dehydrated, and has had associated dry cough. No alleviating/aggravating factors. Recent COVID exposure at work, he has not been vaccinated. Denies fever, chills, chest pain, dyspnea, dizziness, syncope, or N/V/D. He also mentions some dysuria that comes and goes for awhile now, thinks he may have a UTI but is unsure, denies  testicular pain/swelling or pain with bowel movements. He denies any chance of STI.   HPI     Past Medical History:  Diagnosis Date  . H. pylori infection   . Hypospadias   . Syphilis   . UTI (lower urinary tract infection)     There are no problems to display for this patient.   Past Surgical History:  Procedure Laterality Date  . HERNIA REPAIR         Family History  Problem Relation Age of Onset  . Diabetes Mother   . Diabetes Father     Social History   Tobacco Use  . Smoking status: Former Games developer  . Smokeless tobacco: Never Used  Substance Use Topics  . Alcohol use: Not Currently  . Drug use: No    Home Medications Prior to Admission medications   Medication Sig Start Date End Date Taking? Authorizing Provider  famotidine (PEPCID) 20 MG tablet Take 1 tablet (20 mg total) by mouth 2 (two) times daily. 05/23/19   Darr, Veryl Speak, PA-C  naproxen (NAPROSYN) 500 MG tablet Take 1 tablet (500 mg total) by mouth 2 (two) times daily. 03/21/19   Jeannie Fend, PA-C  omeprazole (PRILOSEC) 20 MG capsule Take 1 capsule (20 mg total) by mouth daily. 12/09/18 03/04/19  Wieters, Hallie C, PA-C  ranitidine (ZANTAC) 300 MG tablet Take 0.5 tablets (150 mg total)  by mouth at bedtime. 12/12/17 12/09/18  Wurst, [place], PA-C    Allergies    Septra [sulfamethoxazole-trimethoprim]  Review of Systems   Review of Systems  Constitutional: Positive for fatigue. Negative for chills and fever.  HENT: Negative for congestion, ear pain and sore throat.   Respiratory: Positive for cough. Negative for shortness of breath.   Gastrointestinal: Negative for abdominal pain, diarrhea, nausea, rectal pain and vomiting.  Genitourinary: Positive for dysuria. Negative for discharge, penile pain, penile swelling, scrotal swelling and testicular pain.  Neurological: Positive for weakness (generalized). Negative for dizziness, syncope and headaches.  All other systems reviewed and are negative.   Physical Exam Updated Vital Signs BP 138/88   Pulse 88   Temp 99.1 F (37.3 C) (Oral)   Resp 17   Ht 6' (1.829 m)   Wt 110 kg   SpO2 96%   BMI 32.89 kg/m   Physical Exam Vitals and nursing note reviewed.  Constitutional:      General: He is not in acute distress.    Appearance: He is well-developed. He is not toxic-appearing.  HENT:     Head: Normocephalic and atraumatic.     Mouth/Throat:     Mouth: Mucous membranes are dry.  Eyes:     General:        Right eye: No discharge.  Left eye: No discharge.     Conjunctiva/sclera: Conjunctivae normal.  Cardiovascular:     Rate and Rhythm: Normal rate and regular rhythm.  Pulmonary:     Effort: Pulmonary effort is normal. No respiratory distress.     Breath sounds: Normal breath sounds. No wheezing, rhonchi or rales.  Abdominal:     General: There is no distension.     Palpations: Abdomen is soft.     Tenderness: There is no abdominal tenderness. There is no right CVA tenderness, left CVA tenderness, guarding or rebound.  Genitourinary:    Comments: Patient declined.  Musculoskeletal:     Cervical back: Neck supple.  Skin:    General: Skin is warm and dry.     Findings: No rash.  Neurological:      General: No focal deficit present.     Mental Status: He is alert.     Comments: Clear speech.   Psychiatric:        Behavior: Behavior normal.     ED Results / Procedures / Treatments   Labs (all labs ordered are listed, but only abnormal results are displayed) Labs Reviewed  SARS CORONAVIRUS 2 BY RT PCR (HOSPITAL ORDER, [place] [facility]) - Abnormal; Notable for the following components:      Result Value   SARS Coronavirus 2 POSITIVE (*)    All other components within normal limits  COMPREHENSIVE METABOLIC PANEL - Abnormal; Notable for the following components:   Calcium 8.7 (*)    All other components within normal limits  URINALYSIS, ROUTINE W REFLEX MICROSCOPIC - Abnormal; Notable for the following components:   Color, Urine AMBER (*)    APPearance HAZY (*)    Hgb urine dipstick MODERATE (*)    Ketones, ur 5 (*)    Protein, ur 30 (*)    Nitrite POSITIVE (*)    Leukocytes,Ua LARGE (*)    WBC, UA >50 (*)    Bacteria, UA MANY (*)    All other components within normal limits  URINE CULTURE  CBC WITH DIFFERENTIAL/PLATELET  URINALYSIS, ROUTINE W REFLEX MICROSCOPIC  GC/CHLAMYDIA PROBE AMP ([place]) NOT AT [facility]    EKG None  Radiology DG Chest Port 1 View  Result Date: 10/04/2019 CLINICAL DATA:  Cough EXAM: PORTABLE CHEST 1 VIEW COMPARISON:  Aug 30, 2017 FINDINGS: The heart size is mildly enlarged. There is mild vascular congestion without overt pulmonary edema. There is no pneumothorax or pleural effusion. There is no focal infiltrate. IMPRESSION: Mild cardiomegaly and vascular congestion without overt pulmonary edema. Electronically Signed   By: Constance Holster M.D.   On: 10/04/2019 02:58    Procedures Procedures (including critical care time)  Medications Ordered in ED Medications - No data to display  ED Course  I have reviewed the triage vital signs and the nursing notes.  Pertinent labs & imaging results that were available during my  care of the patient were reviewed by me and considered in my medical decision making (see chart for details).  Larry Aguirre was evaluated in Emergency Department on 10/04/2019 for the symptoms described in the history of present illness. He/she was evaluated in the context of the global COVID-19 pandemic, which necessitated consideration that the patient might be at risk for infection with the SARS-CoV-2 virus that causes COVID-19. Institutional protocols and algorithms that pertain to the evaluation of patients at risk for COVID-19 are in a state of rapid change based on information released by regulatory bodies including  the [employer] and federal and state organizations. These policies and algorithms were followed during the patient's care in the ED.  MDM Rules/Calculators/A&P                         Patient presents to the ED with complaints of fatigue. Nontoxic, resting comfortably, vitals without significant abnormality.   Additional history obtained:  Additional history obtained from chart & nursing note review.  Lab Tests:  I reviewed and interpreted labs, which included:  CBC: No significant anemia or leukocytosis CMP: Mild hypocalcemia, no significant electrolyte derangement.  Renal function and LFTs preserved. Urinalysis: with UTI GC/Chlamydia: Obtained given patient's dysuria, however he adamantly denies chance of this being an STI Covid testing:Positive.   Imaging Studies ordered:  I ordered imaging studies which included CXR, I independently visualized and interpreted imaging which showed Mild cardiomegaly and vascular congestion without overt pulmonary edema per radiology.--> Patient is without peripheral edema, no signs of respiratory distress, lungs are clear, question if this is more findings related to covid.   ED Course:  Suspect patient's weakness/fatigue and cough are secondary to COVID-19.  I personally ambulated the patient throughout his exam room with SPO2 maintaining @  100%.  Chest x-ray shows mild cardiomegaly and vascular congestion per radiology, no overt return of edema, question if this is more Covid related, no peripheral edema or adventitious breath sounds.  Patient is on a respiratory distress.  Does not appear to require admission for his COVID-19.  We will treat supportively.  We discussed need for isolation.  His urinalysis is consistent with infection, no history components to raise concern for epididymitis, orchitis, or prostatitis. Afebrile, no flank tenderness, no vomiting, do not suspect pyelonephritis.  Patient is without concern for STI.  Will treat with antibiotics.  PCP follow up. I discussed results, treatment plan, need for follow-up, and return precautions with the patient. Provided opportunity for questions, patient confirmed understanding and is in agreement with plan.   Findings and plan of care discussed with supervising physician Dr. Blinda Leatherwood who is in agreement.   Portions of this note were generated with Scientist, clinical (histocompatibility and immunogenetics). Dictation errors may occur despite best attempts at proofreading.  Final Clinical Impression(s) / ED Diagnoses Final diagnoses:  Acute cystitis with hematuria  COVID-19    Rx / DC Orders ED Discharge Orders         Ordered    benzonatate (TESSALON) 100 MG capsule  Every 8 hours     Discontinue  Reprint     10/04/19 0615    cefpodoxime (VANTIN) 200 MG tablet  2 times daily     Discontinue  Reprint     10/04/19 0615           Isacc Turney, Pleas Koch, PA-C 10/04/19 0618    Gilda Crease, MD 10/04/19 [id number]

## 2019-10-04 NOTE — Discharge Instructions (Signed)
You were seen in the emergency department today for weakness, cough, and urinary symptoms.  Your COVID-19 test was positive.  We suspect this is the cause of your symptoms.  You had a bit of fluid on your chest x-ray per radiology, this may be related to COVID-19, please have this rechecked by your primary care provider within 2 weeks.  We are sending home with Tessalon to take every 8 hours as needed for cough. We are sending you home with Cefpodoxime to take twice per day for the next 10 days for your UTI. This is an antibiotic.   We have prescribed you new medication(s) today. Discuss the medications prescribed today with your pharmacist as they can have adverse effects and interactions with your other medicines including over the counter and prescribed medications. Seek medical evaluation if you start to experience new or abnormal symptoms after taking one of these medicines, seek care immediately if you start to experience difficulty breathing, feeling of your throat closing, facial swelling, or rash as these could be indications of a more serious allergic reaction  Please be sure to stay well-hydrated.  We are instructing patient's with COVID 19 or symptoms of COVID 19 to quarantine themselves for 14 days. You may be able to discontinue self quarantine if the following conditions are met:   Persons with COVID-19 who have symptoms and were directed to care for themselves at home may discontinue home isolation under the  following conditions: - It has been at least 7 days have passed since symptoms first appeared. - AND at least 3 days (72 hours) have passed since recovery defined as resolution of fever without the use of fever-reducing medications and improvement in respiratory symptoms (e.g., cough, shortness of breath)  Please follow the below quarantine instructions.   Please follow up with primary care within 3-5 days for re-evaluation- call prior to going to the office to make them aware  of your symptoms as some offices are altering their method of seeing patients with COVID 19 symptoms. Return to the ER for new or worsening symptoms including but not limited to increased work of breathing, chest pain, passing out, testicular pain/swelling, pain with bowel movements, pain in your back, fever, abdominal pain, if you cannot keep fluids down, or any other concerns.       Person Under Monitoring Name: Larry Aguirre  Location: [place] [address]   Infection Prevention Recommendations for Individuals Confirmed to have, or Being Evaluated for, 2019 Novel Coronavirus (COVID-19) Infection Who Receive Care at Home  Individuals who are confirmed to have, or are being evaluated for, COVID-19 should follow the prevention steps below until a healthcare provider or local or state health department says they can return to normal activities.  Stay home except to get medical care You should restrict activities outside your home, except for getting medical care. Do not go to work, school, or public areas, and do not use public transportation or taxis.  Call ahead before visiting your doctor Before your medical appointment, call the healthcare provider and tell them that you have, or are being evaluated for, COVID-19 infection. This will help the healthcare provider's office take steps to keep other people from getting infected. Ask your healthcare provider to call the local or state health department.  Monitor your symptoms Seek prompt medical attention if your illness is worsening (e.g., difficulty breathing). Before going to your medical appointment, call the healthcare provider and tell them that you have, or are  being evaluated for, COVID-19 infection. Ask your healthcare provider to call the local or state health department.  Wear a facemask You should wear a facemask that covers your nose and mouth when you are in the same room with other people and when you  visit a healthcare provider. People who live with or visit you should also wear a facemask while they are in the same room with you.  Separate yourself from other people in your home As much as possible, you should stay in a different room from other people in your home. Also, you should use a separate bathroom, if available.  Avoid sharing household items You should not share dishes, drinking glasses, cups, eating utensils, towels, bedding, or other items with other people in your home. After using these items, you should wash them thoroughly with soap and water.  Cover your coughs and sneezes Cover your mouth and nose with a tissue when you cough or sneeze, or you can cough or sneeze into your sleeve. Throw used tissues in a lined trash can, and immediately wash your hands with soap and water for at least 20 seconds or use an alcohol-based hand rub.  Wash your [facility] your hands often and thoroughly with soap and water for at least 20 seconds. You can use an alcohol-based hand sanitizer if soap and water are not available and if your hands are not visibly dirty. Avoid touching your eyes, nose, and mouth with unwashed hands.   Prevention Steps for Caregivers and Household Members of Individuals Confirmed to have, or Being Evaluated for, COVID-19 Infection Being Cared for in the Home  If you live with, or provide care at home for, a person confirmed to have, or being evaluated for, COVID-19 infection please follow these guidelines to prevent infection:  Follow healthcare provider's instructions Make sure that you understand and can help the patient follow any healthcare provider instructions for all care.  Provide for the patient's basic needs You should help the patient with basic needs in the home and provide support for getting groceries, prescriptions, and other personal needs.  Monitor the patient's symptoms If they are getting sicker, call his or her medical provider and  tell them that the patient has, or is being evaluated for, COVID-19 infection. This will help the healthcare provider's office take steps to keep other people from getting infected. Ask the healthcare provider to call the local or state health department.  Limit the number of people who have contact with the patient If possible, have only one caregiver for the patient. Other household members should stay in another home or place of residence. If this is not possible, they should stay in another room, or be separated from the patient as much as possible. Use a separate bathroom, if available. Restrict visitors who do not have an essential need to be in the home.  Keep older adults, very young children, and other sick people away from the patient Keep older adults, very young children, and those who have compromised immune systems or chronic health conditions away from the patient. This includes people with chronic heart, lung, or kidney conditions, diabetes, and cancer.  Ensure good ventilation Make sure that shared spaces in the home have good air flow, such as from an air conditioner or an opened window, weather permitting.  Wash your hands often Wash your hands often and thoroughly with soap and water for at least 20 seconds. You can use an alcohol based hand sanitizer if soap and  water are not available and if your hands are not visibly dirty. Avoid touching your eyes, nose, and mouth with unwashed hands. Use disposable paper towels to dry your hands. If not available, use dedicated cloth towels and replace them when they become wet.  Wear a facemask and gloves Wear a disposable facemask at all times in the room and gloves when you touch or have contact with the patient's blood, body fluids, and/or secretions or excretions, such as sweat, saliva, sputum, nasal mucus, vomit, urine, or feces.  Ensure the mask fits over your nose and mouth tightly, and do not touch it during use. Throw out  disposable facemasks and gloves after using them. Do not reuse. Wash your hands immediately after removing your facemask and gloves. If your personal clothing becomes contaminated, carefully remove clothing and launder. Wash your hands after handling contaminated clothing. Place all used disposable facemasks, gloves, and other waste in a lined container before disposing them with other household waste. Remove gloves and wash your hands immediately after handling these items.  Do not share dishes, glasses, or other household items with the patient Avoid sharing household items. You should not share dishes, drinking glasses, cups, eating utensils, towels, bedding, or other items with a patient who is confirmed to have, or being evaluated for, COVID-19 infection. After the person uses these items, you should wash them thoroughly with soap and water.  Wash laundry thoroughly Immediately remove and wash clothes or bedding that have blood, body fluids, and/or secretions or excretions, such as sweat, saliva, sputum, nasal mucus, vomit, urine, or feces, on them. Wear gloves when handling laundry from the patient. Read and follow directions on labels of laundry or clothing items and detergent. In general, wash and dry with the warmest temperatures recommended on the label.  Clean all areas the individual has used often Clean all touchable surfaces, such as counters, tabletops, doorknobs, bathroom fixtures, toilets, phones, keyboards, tablets, and bedside tables, every day. Also, clean any surfaces that may have blood, body fluids, and/or secretions or excretions on them. Wear gloves when cleaning surfaces the patient has come in contact with. Use a diluted bleach solution (e.g., dilute bleach with 1 part bleach and 10 parts water) or a household disinfectant with a label that says EPA-registered for coronaviruses. To make a bleach solution at home, add 1 tablespoon of bleach to 1 quart (4 cups) of water.  For a larger supply, add  cup of bleach to 1 gallon (16 cups) of water. Read labels of cleaning products and follow recommendations provided on product labels. Labels contain instructions for safe and effective use of the cleaning product including precautions you should take when applying the product, such as wearing gloves or eye protection and making sure you have good ventilation during use of the product. Remove gloves and wash hands immediately after cleaning.  Monitor yourself for signs and symptoms of illness Caregivers and household members are considered close contacts, should monitor their health, and will be asked to limit movement outside of the home to the extent possible. Follow the monitoring steps for close contacts listed on the symptom monitoring form.   ? If you have additional questions, contact your local health department or call the epidemiologist on call at [phone] (available 24/7). ? This guidance is subject to change. For the most up-to-date guidance from [facility], please refer to their website: [email or web address]

## 2019-10-05 ENCOUNTER — Encounter: Payer: Self-pay | Admitting: Physician Assistant

## 2019-10-05 ENCOUNTER — Telehealth: Payer: Self-pay | Admitting: Physician Assistant

## 2019-10-05 LAB — URINE CULTURE: Culture: >=100000 — AB

## 2019-10-05 NOTE — Telephone Encounter (Signed)
Called to discuss with Franchot Mimes about Covid symptoms and the use of bamlanivimab/etesevimab or casirivimab/imdevimab, a monoclonal antibody infusion for those with mild to moderate Covid symptoms and at a high risk of hospitalization.     Pt is qualified for this infusion at the [facility] infusion center due to co-morbid conditions and/or a member of an at-risk group, however declines infusion at this time. Symptoms tier reviewed as well as criteria for ending isolation.  Symptoms reviewed that would warrant ED/Hospital evaluation. Preventative practices reviewed. Patient verbalized understanding. Patient advised to call back if he decides that he does want to get infusion. Callback number to the infusion center given. Patient advised to go to Urgent care or ED with severe symptoms. Patient was seen in the ER for fatigue on 6/12. He tested positive for covid but now feels great. He says those symptoms had nothing to do with coronavirus and he feels fine. Not interested in mAB treatment.    Cline Crock PA-C

## 2019-11-14 ENCOUNTER — Other Ambulatory Visit: Payer: Self-pay

## 2019-11-14 ENCOUNTER — Ambulatory Visit (HOSPITAL_COMMUNITY)
Admission: EM | Admit: 2019-11-14 | Discharge: 2019-11-14 | Disposition: A | Discharge status: Home / Self Care | Payer: Self-pay | Attending: Family Medicine | Admitting: Family Medicine

## 2019-11-14 ENCOUNTER — Encounter (HOSPITAL_COMMUNITY): Payer: Self-pay

## 2019-11-14 DIAGNOSIS — K0889 Other specified disorders of teeth and supporting structures: Secondary | ICD-10-CM

## 2019-11-14 MED ORDER — HYDROCODONE-ACETAMINOPHEN 7.5-325 MG PO TABS: 1.0000 | ORAL_TABLET | Freq: Four times a day (QID) | ORAL | 0 refills | Status: DC | PRN

## 2019-11-14 MED ORDER — AMOXICILLIN 875 MG PO TABS: 875.0000 mg | ORAL_TABLET | Freq: Two times a day (BID) | ORAL | 0 refills | Status: DC

## 2019-11-14 NOTE — Discharge Instructions (Signed)
Take the antibiotic 2 times a day Take ibuprofen 800 mg 3 times a day for moderate pain Take hydrocodone as needed for severe pain.  Do not take hydrocodone and drive

## 2019-11-14 NOTE — ED Provider Notes (Signed)
[facility]    CSN: [id number] Arrival date & time: 11/14/19  1044      History   Chief Complaint Chief Complaint  Patient presents with  . Dental Pain    HPI Larry Aguirre is a 49 y.o. male.   HPI   Patient is here for dental pain.  He has an appointment with the dentist on Monday.  He had to leave work early today because the pain was interfering with his ability to concentrate and do his job.  Past Medical History:  Diagnosis Date  . H. pylori infection   . Hypospadias   . Obesity (BMI 30-39.9)   . Syphilis   . UTI (lower urinary tract infection)     There are no problems to display for this patient.   Past Surgical History:  Procedure Laterality Date  . HERNIA REPAIR         Home Medications    Prior to Admission medications   Medication Sig Start Date End Date Taking? Authorizing Provider  amoxicillin (AMOXIL) 875 MG tablet Take 1 tablet (875 mg total) by mouth 2 (two) times daily. 11/14/19   Eustace Moore, MD  HYDROcodone-acetaminophen (NORCO) 7.5-325 MG tablet Take 1 tablet by mouth every 6 (six) hours as needed for moderate pain. 11/14/19   Eustace Moore, MD  famotidine (PEPCID) 20 MG tablet Take 1 tablet (20 mg total) by mouth 2 (two) times daily. Patient not taking: Reported on 10/04/2019 05/23/19 10/04/19  Darr, Veryl Speak, PA-C  omeprazole (PRILOSEC) 20 MG capsule Take 1 capsule (20 mg total) by mouth daily. 12/09/18 03/04/19  Wieters, Hallie C, PA-C  ranitidine (ZANTAC) 300 MG tablet Take 0.5 tablets (150 mg total) by mouth at bedtime. 12/12/17 12/09/18  Rennis Harding, PA-C    Family History Family History  Problem Relation Age of Onset  . Diabetes Mother   . Diabetes Father     Social History Social History   Tobacco Use  . Smoking status: Former Games developer  . Smokeless tobacco: Never Used  Substance Use Topics  . Alcohol use: Not Currently  . Drug use: No     Allergies   Septra  [sulfamethoxazole-trimethoprim]   Review of Systems Review of Systems See HPI  Physical Exam Triage Vital Signs ED Triage Vitals  Enc Vitals Group     BP 11/14/19 1115 126/67     Pulse Rate 11/14/19 1115 58     Resp 11/14/19 1115 14     Temp 11/14/19 1115 98.2 F (36.8 C)     Temp src --      SpO2 11/14/19 1115 100 %     Weight --      Height --      Head Circumference --      Peak Flow --      Pain Score 11/14/19 1114 10     Pain Loc --      Pain Edu? --      Excl. in GC? --    No data found.  Updated Vital Signs BP 126/67   Pulse 58   Temp 98.2 F (36.8 C)   Resp 14   SpO2 100%       Physical Exam Constitutional:      General: He is not in acute distress.    Appearance: He is well-developed.  HENT:     Head: Normocephalic and atraumatic.     Mouth/Throat:   Eyes:     Conjunctiva/sclera: Conjunctivae normal.  Pupils: Pupils are equal, round, and reactive to light.  Cardiovascular:     Rate and Rhythm: Normal rate.  Pulmonary:     Effort: Pulmonary effort is normal. No respiratory distress.  Abdominal:     General: There is no distension.     Palpations: Abdomen is soft.  Musculoskeletal:        General: Normal range of motion.     Cervical back: Normal range of motion.  Skin:    General: Skin is warm and dry.  Neurological:     Mental Status: He is alert.      UC Treatments / Results  Labs (all labs ordered are listed, but only abnormal results are displayed) Labs Reviewed - No data to display  EKG   Radiology No results found.  Procedures Procedures (including critical care time)  Medications Ordered in UC Medications - No data to display  Initial Impression / Assessment and Plan / UC Course  I have reviewed the triage vital signs and the nursing notes.  Pertinent labs & imaging results that were available during my care of the patient were reviewed by me and considered in my medical decision making (see chart for  details).      Final Clinical Impressions(s) / UC Diagnoses   Final diagnoses:  Pain, dental     Discharge Instructions     Take the antibiotic 2 times a day Take ibuprofen 800 mg 3 times a day for moderate pain Take hydrocodone as needed for severe pain.  Do not take hydrocodone and drive   ED Prescriptions    Medication Sig Dispense Auth. Provider   HYDROcodone-acetaminophen (NORCO) 7.5-325 MG tablet Take 1 tablet by mouth every 6 (six) hours as needed for moderate pain. 15 tablet Eustace Moore, MD   amoxicillin (AMOXIL) 875 MG tablet Take 1 tablet (875 mg total) by mouth 2 (two) times daily. 14 tablet Eustace Moore, MD     I have reviewed the PDMP during this encounter.   Eustace Moore, MD 11/14/19 [phone]

## 2019-11-14 NOTE — ED Triage Notes (Signed)
Patient reports dental pain x3-4 days. Reports he has an appointment at a dental clinic Monday morning but needs abx/pain medication. Reports tylenol/motrin has not helped his pain at all.

## 2020-04-06 ENCOUNTER — Other Ambulatory Visit: Payer: Self-pay

## 2020-04-06 ENCOUNTER — Emergency Department (HOSPITAL_COMMUNITY)
Admission: EM | Admit: 2020-04-06 | Discharge: 2020-04-06 | Disposition: A | Discharge status: Home / Self Care | Payer: Self-pay | Attending: Emergency Medicine | Admitting: Emergency Medicine

## 2020-04-06 ENCOUNTER — Emergency Department (HOSPITAL_COMMUNITY): Payer: Self-pay

## 2020-04-06 ENCOUNTER — Encounter (HOSPITAL_COMMUNITY): Payer: Self-pay | Admitting: Emergency Medicine

## 2020-04-06 DIAGNOSIS — F410 Panic disorder [episodic paroxysmal anxiety] without agoraphobia: Secondary | ICD-10-CM

## 2020-04-06 DIAGNOSIS — F419 Anxiety disorder, unspecified: Secondary | ICD-10-CM | POA: Insufficient documentation

## 2020-04-06 DIAGNOSIS — R0602 Shortness of breath: Secondary | ICD-10-CM | POA: Insufficient documentation

## 2020-04-06 DIAGNOSIS — Z79899 Other long term (current) drug therapy: Secondary | ICD-10-CM | POA: Insufficient documentation

## 2020-04-06 LAB — BASIC METABOLIC PANEL
Anion gap: 12 (ref 5–15)
BUN: 14 mg/dL (ref 6–20)
CO2: 23 mmol/L (ref 22–32)
Calcium: 9.7 mg/dL (ref 8.9–10.3)
Chloride: 103 mmol/L (ref 98–111)
Creatinine, Ser: 0.87 mg/dL (ref 0.61–1.24)
GFR, Estimated: >60 mL/min (ref >60)
Glucose, Bld: 93 mg/dL (ref 70–99)
Potassium: 3.5 mmol/L (ref 3.5–5.1)
Sodium: 138 mmol/L (ref 135–145)

## 2020-04-06 LAB — CBC WITH DIFFERENTIAL/PLATELET
Abs Immature Granulocytes: 0.01 10*3/uL (ref 0.00–0.07)
Basophils Absolute: 0 10*3/uL (ref 0.0–0.1)
Basophils Relative: 1 %
Eosinophils Absolute: 0.1 10*3/uL (ref 0.0–0.5)
Eosinophils Relative: 2 %
HCT: 42.8 % (ref 39.0–52.0)
Hemoglobin: 13.8 g/dL (ref 13.0–17.0)
Immature Granulocytes: 0 %
Lymphocytes Relative: 50 %
Lymphs Abs: 2.7 10*3/uL (ref 0.7–4.0)
MCH: 28.5 pg (ref 26.0–34.0)
MCHC: 32.2 g/dL (ref 30.0–36.0)
MCV: 88.4 fL (ref 80.0–100.0)
Monocytes Absolute: 0.5 10*3/uL (ref 0.1–1.0)
Monocytes Relative: 9 %
Neutro Abs: 2.1 10*3/uL (ref 1.7–7.7)
Neutrophils Relative %: 38 %
Platelets: 194 10*3/uL (ref 150–400)
RBC: 4.84 MIL/uL (ref 4.22–5.81)
RDW: 12.9 % (ref 11.5–15.5)
WBC: 5.5 10*3/uL (ref 4.0–10.5)
nRBC: 0 % (ref 0.0–0.2)

## 2020-04-06 MED ORDER — HYDROXYZINE HCL 25 MG PO TABS: 25.0000 mg | ORAL_TABLET | Freq: Four times a day (QID) | ORAL | 0 refills | Status: DC | PRN

## 2020-04-06 MED ORDER — LORAZEPAM 1 MG PO TABS
1.0000 mg | ORAL_TABLET | Freq: Once | ORAL | Status: AC
  Administered 2020-04-06: 18:00:00 1 mg via ORAL
  Filled 2020-04-06: qty 1

## 2020-04-06 NOTE — ED Triage Notes (Signed)
Patient arrived with EMS from home reports SOB this evening , denies cough or fever , no chest pain , lungs clear , O2 sat=100% room air.

## 2020-04-06 NOTE — Discharge Instructions (Signed)
Please read and follow all provided instructions.  Your diagnoses today include:  1. Anxiety attack   2. Shortness of breath     Tests performed today include:  An EKG of your heart  Blood counts and electrolytes  Vital signs. See below for your results today.   Medications prescribed:   Hydroxyzine - antihistamine  You can find this medication over-the-counter.   This medication will make you drowsy. DO NOT drive or perform any activities that require you to be awake and alert if taking this.  Take any prescribed medications only as directed.  Follow-up instructions: Please follow-up with your primary care provider as soon as you can for further evaluation of your symptoms.   Return instructions:  SEEK IMMEDIATE MEDICAL ATTENTION IF:  You have severe chest pain, especially if the pain is crushing or pressure-like and spreads to the arms, back, neck, or jaw, or if you have sweating, nausea (feeling sick to your stomach), or shortness of breath. THIS IS AN EMERGENCY. Don't wait to see if the pain will go away. Get medical help at once. Call 911 or 0 (operator). DO NOT drive yourself to the hospital.   Your chest pain gets worse and does not go away with rest.   You have an attack of chest pain lasting longer than usual, despite rest and treatment with the medications your caregiver has prescribed.   You wake from sleep with chest pain or shortness of breath.  You feel dizzy or faint.  You have chest pain not typical of your usual pain for which you originally saw your caregiver.   You have any other emergent concerns regarding your health.  Additional Information: Chest pain comes from many different causes. Your caregiver has diagnosed you as having chest pain that is not specific for one problem, but does not require admission.  You are at low risk for an acute heart condition or other serious illness.   Your vital signs today were: BP (!) 153/105 (BP Location: Left  Arm)   Pulse (!) 53   Temp 98.7 F (37.1 C) (Oral)   Resp 17   Ht 6\' 1"  (1.854 m)   Wt 107 kg   SpO2 100%   BMI 31.12 kg/m  If your blood pressure (BP) was elevated above 135/85 this visit, please have this repeated by your doctor within one month. --------------

## 2020-04-06 NOTE — ED Provider Notes (Signed)
[facility] EMERGENCY DEPARTMENT Provider Note   CSN: [id number] Arrival date & time: 04/06/20  0346     History Chief Complaint  Patient presents with  . Shortness of Breath    Larry Aguirre is a 49 y.o. male.  Patient presents the emergency department for evaluation of shortness of breath.  Patient states that he has had extreme anxiety at times over the past 1 week.  He states that he has been having difficulty sleeping and has not slept in about 30 hours.  Patient seen after prolonged wait time.  Transported by EMS.  States that he has been breathing fast and heavy, causing tingling in his face, hands and feet.  He reports discomfort in the lower chest that does not radiate.  No associated vomiting, diaphoresis.  Patient states that he has been very stressed and that makes his symptoms worse.  No cough or fever.  No history of hypertension, hypercholesterol, diabetes, smoking.  Family history of high blood pressure and diabetes in parents.  He has not had anxiety attacks in the past. Patient denies risk factors for pulmonary embolism including: unilateral leg swelling, history of DVT/PE/other blood clots, use of exogenous hormones, recent immobilizations, recent surgery, recent travel (>4hr segment), malignancy, hemoptysis.          Past Medical History:  Diagnosis Date  . H. pylori infection   . Hypospadias   . Obesity (BMI 30-39.9)   . Syphilis   . UTI (lower urinary tract infection)     There are no problems to display for this patient.   Past Surgical History:  Procedure Laterality Date  . HERNIA REPAIR         Family History  Problem Relation Age of Onset  . Diabetes Mother   . Diabetes Father     Social History   Tobacco Use  . Smoking status: Former Games developer  . Smokeless tobacco: Never Used  Substance Use Topics  . Alcohol use: Not Currently  . Drug use: No    Home Medications Prior to Admission medications   Medication Sig  Start Date End Date Taking? Authorizing Provider  amoxicillin (AMOXIL) 875 MG tablet Take 1 tablet (875 mg total) by mouth 2 (two) times daily. 11/14/19   Eustace Moore, MD  HYDROcodone-acetaminophen (NORCO) 7.5-325 MG tablet Take 1 tablet by mouth every 6 (six) hours as needed for moderate pain. 11/14/19   Eustace Moore, MD  famotidine (PEPCID) 20 MG tablet Take 1 tablet (20 mg total) by mouth 2 (two) times daily. Patient not taking: Reported on 10/04/2019 05/23/19 10/04/19  Darr, Gerilyn Pilgrim, PA-C  omeprazole (PRILOSEC) 20 MG capsule Take 1 capsule (20 mg total) by mouth daily. 12/09/18 03/04/19  Wieters, Hallie C, PA-C  ranitidine (ZANTAC) 300 MG tablet Take 0.5 tablets (150 mg total) by mouth at bedtime. 12/12/17 12/09/18  Wurst, [place], PA-C    Allergies    Septra [sulfamethoxazole-trimethoprim]  Review of Systems   Review of Systems  Constitutional: Negative for diaphoresis and fever.  Eyes: Negative for redness.  Respiratory: Positive for shortness of breath. Negative for cough.   Cardiovascular: Positive for chest pain. Negative for palpitations and leg swelling.  Gastrointestinal: Negative for abdominal pain, nausea and vomiting.  Genitourinary: Negative for dysuria.  Musculoskeletal: Negative for back pain and neck pain.  Skin: Negative for rash.  Neurological: Positive for numbness. Negative for syncope and light-headedness.  Psychiatric/Behavioral: The patient is nervous/anxious.     Physical Exam Updated Vital Signs  BP (!) 154/89 (BP Location: Left Arm)   Pulse (!) 57   Temp 98.7 F (37.1 C) (Oral)   Resp 14   Ht 6\' 1"  (1.854 m)   Wt 107 kg   SpO2 100%   BMI 31.12 kg/m   Physical Exam Vitals and nursing note reviewed.  Constitutional:      Appearance: He is well-developed and well-nourished.  HENT:     Head: Normocephalic and atraumatic.  Eyes:     General:        Right eye: No discharge.        Left eye: No discharge.     Conjunctiva/sclera: Conjunctivae  normal.  Cardiovascular:     Rate and Rhythm: Regular rhythm. Bradycardia present.     Heart sounds: Normal heart sounds.  Pulmonary:     Effort: Pulmonary effort is normal. Tachypnea present.     Breath sounds: Normal breath sounds. No decreased breath sounds.  Abdominal:     Palpations: Abdomen is soft.     Tenderness: There is no abdominal tenderness.  Musculoskeletal:     Cervical back: Normal range of motion and neck supple.  Skin:    General: Skin is warm and dry.  Neurological:     Mental Status: He is alert.  Psychiatric:        Mood and Affect: Mood is anxious.     ED Results / Procedures / Treatments   Labs (all labs ordered are listed, but only abnormal results are displayed) Labs Reviewed  CBC WITH DIFFERENTIAL/PLATELET  BASIC METABOLIC PANEL    ED ECG REPORT   Date: 04/06/2020  Rate: 53  Rhythm: sinus bradycardia  QRS Axis: normal  Intervals: normal  ST/T Wave abnormalities: normal  Conduction Disutrbances:none  Narrative Interpretation:   Old EKG Reviewed: unchanged  I have personally reviewed the EKG tracing and agree with the computerized printout as noted.  Radiology DG Chest 2 View  Result Date: 04/06/2020 CLINICAL DATA:  Shortness of breath. EXAM: CHEST - 2 VIEW COMPARISON:  10/04/2019. FINDINGS: Mediastinum and hilar structures normal. Heart size normal. Low lung volumes. No focal infiltrate. No pleural effusion or pneumothorax. No acute bony abnormality. IMPRESSION: Low lung volumes. No acute cardiopulmonary disease. Electronically Signed   By: 12/04/2019  Register   On: 04/06/2020 05:09    Procedures Procedures (including critical care time)  Medications Ordered in ED Medications  LORazepam (ATIVAN) tablet 1 mg (1 mg Oral Given 04/06/20 1807)    ED Course  I have reviewed the triage vital signs and the nursing notes.  Pertinent labs & imaging results that were available during my care of the patient were reviewed by me and considered in  my medical decision making (see chart for details).  Patient seen and examined. Work-up reviewed. Patient is standing up pacing in the room appearing very anxious. He does not appear to be in distress but states he is feeling very claustrophobic. EKG reviewed. Doubt ACS/PE. Presentation very suggestive of anxiety.   Vital signs reviewed and are as follows: BP (!) 154/89 (BP Location: Left Arm)   Pulse (!) 57   Temp 98.7 F (37.1 C) (Oral)   Resp 14   Ht 6\' 1"  (1.854 m)   Wt 107 kg   SpO2 100%   BMI 31.12 kg/m   7:06 PM Treated with ativan here. Home with hydroxyzine.  Encouraged PCP follow-up for further evaluation and assessment.  Encouraged return to the emergency department with worsening, persistent, or severe chest pain, difficulty  breathing, or other concerning symptoms.    MDM Rules/Calculators/A&P                          Patient presents with symptoms consistent with anxiety attack.  EKG without ischemic changes.  Routine labs unremarkable.  Has some vague chest discomfort that is atypical for PE or ACS.  Minimal risk factor profile.  No indications for further work-up at this time.   Final Clinical Impression(s) / ED Diagnoses Final diagnoses:  Anxiety attack  Shortness of breath    Rx / DC Orders ED Discharge Orders         Ordered    hydrOXYzine (ATARAX/VISTARIL) 25 MG tablet  Every 6 hours PRN,   Status:  Discontinued        04/06/20 1904    hydrOXYzine (ATARAX/VISTARIL) 25 MG tablet  Every 6 hours PRN        04/06/20 1906    LORazepam (ATIVAN) 1 MG tablet  3 times daily PRN        Pending           Renne Crigler, PA-C 04/06/20 1908    Pollyann Savoy, MD 04/06/20 1926

## 2020-04-07 ENCOUNTER — Telehealth: Payer: Self-pay | Admitting: Surgery

## 2020-04-07 ENCOUNTER — Ambulatory Visit (HOSPITAL_COMMUNITY)
Admission: EM | Admit: 2020-04-07 | Discharge: 2020-04-07 | Disposition: A | Discharge status: Home / Self Care | Payer: Self-pay | Attending: Emergency Medicine | Admitting: Emergency Medicine

## 2020-04-07 ENCOUNTER — Encounter (HOSPITAL_COMMUNITY): Payer: Self-pay

## 2020-04-07 DIAGNOSIS — F419 Anxiety disorder, unspecified: Secondary | ICD-10-CM

## 2020-04-07 DIAGNOSIS — F4321 Adjustment disorder with depressed mood: Secondary | ICD-10-CM

## 2020-04-07 MED ORDER — HYDROXYZINE HCL 50 MG PO TABS: 50.0000 mg | ORAL_TABLET | Freq: Four times a day (QID) | ORAL | 1 refills | Status: AC | PRN

## 2020-04-07 NOTE — Telephone Encounter (Signed)
ED CM received call that prescription was called into a pharmacy which is closed and is requesting we call prescription into the 24 hour [facility] on [address]

## 2020-04-07 NOTE — ED Provider Notes (Signed)
HPI  SUBJECTIVE:  Larry Aguirre is a 49 y.o. male who presents with anxiety / panic attacks for the past several weeks.  He states that he is very stressed and is grieving the recent loss of his mother, who died a month ago.  He reports insomnia, states it is getting 2 to 3 hours of sleep at night.  He describes the panic attacks as shortness of breath, palpitations, nausea, diaphoresis, perioral tingling, hand and feet tingling.  He also reports feelings of guilt, worthlessness decreased appetite.  Denies excess crying, homicidal suicidal ideation, auditory visual hallucinations.  No unintentional weight loss, heat or cold intolerance.  Denies illicit substance use, most specifically cocaine or stimulants.  Seen in the ER yesterday for shortness of breath.  EKG, chest x-ray, basic labs unremarkable.  Thought to have anxiety attack, and was given Ativan 1 mg in the ED.  Sent home with hydroxyzine 25 mg every 6 hours.  States that he has tried this without improvement in his symptoms.  He has follow-up with behavioral health tomorrow.  There are no aggravating or alleviating factors.  He has a past medical history of depression, but has never been treated for it.  No history of psychiatric missions, hypothyroidism, diabetes, hypertension.  Family history significant for anxiety.  PMD: None.  Past Medical History:  Diagnosis Date  . H. pylori infection   . Hypospadias   . Obesity (BMI 30-39.9)   . Syphilis   . UTI (lower urinary tract infection)     Past Surgical History:  Procedure Laterality Date  . HERNIA REPAIR      Family History  Problem Relation Age of Onset  . Diabetes Mother   . Diabetes Father     Social History   Tobacco Use  . Smoking status: Former Games developer  . Smokeless tobacco: Never Used  Substance Use Topics  . Alcohol use: Not Currently  . Drug use: No    No current facility-administered medications for this encounter.  Current Outpatient Medications:  .   hydrOXYzine (ATARAX/VISTARIL) 50 MG tablet, Take 1 tablet (50 mg total) by mouth every 6 (six) hours as needed for anxiety. May take 2 tabs at night, Disp: 30 tablet, Rfl: 1  Allergies  Allergen Reactions  . Septra [Sulfamethoxazole-Trimethoprim] Rash     ROS  As noted in HPI.   Physical Exam  BP 112/77 (BP Location: Left Arm)   Pulse 60   Temp 98.2 F (36.8 C) (Oral)   Resp 16   SpO2 98%   Constitutional: Well developed, well nourished, no acute distress Eyes:  EOMI, conjunctiva normal bilaterally HENT: Normocephalic, atraumatic,mucus membranes moist Neck: No thyroid tenderness, thyromegaly Respiratory: Normal inspiratory effort lungs clear bilaterally Cardiovascular: Normal rate, regular rhythm no murmurs rubs or gallop GI: nondistended skin: No rash, skin intact Musculoskeletal: no deformities Neurologic: Alert & oriented x 3, no focal neuro deficits.  DTRs upper lower extremities 2+ and equal. Psychiatric: Speech and behavior appropriate.  Full affect.  No apparent suicidal homicidal ideation.  No apparent auditory or visual hallucinations.  Appears anxious.   ED Course   Medications - No data to display  No orders of the defined types were placed in this encounter.   No results found for this or any previous visit (from the past 24 hour(s)). DG Chest 2 View  Result Date: 04/06/2020 CLINICAL DATA:  Shortness of breath. EXAM: CHEST - 2 VIEW COMPARISON:  10/04/2019. FINDINGS: Mediastinum and hilar structures normal. Heart size normal. Low  lung volumes. No focal infiltrate. No pleural effusion or pneumothorax. No acute bony abnormality. IMPRESSION: Low lung volumes. No acute cardiopulmonary disease. Electronically Signed   By: Maisie Fus  Register   On: 04/06/2020 05:09    ED Clinical Impression  1. Anxiety   2. Grief reaction      ED Assessment/Plan  ER records reviewed.  As noted in HPI.  [facility] narcotic database reviewed.  No benzodiazepine  prescriptions in the past 2 years.  Patient with anxiety, with perhaps a component of depression with it.  He is grieving the recent loss of his mother.  Will increase his Vistaril to 50 mg 3 times a day, may take 100 mg at night.  Has follow-up with behavioral health tomorrow.  Discussed with him the habit-forming tendencies of benzodiazepines and that we do not regularly prescribe benzodiazepines from the urgent care.  He is amenable to this plan.  Discussed  MDM, treatment plan, and plan for follow-up with patient. patient agrees with plan.   Meds ordered this encounter  Medications  . hydrOXYzine (ATARAX/VISTARIL) 50 MG tablet    Sig: Take 1 tablet (50 mg total) by mouth every 6 (six) hours as needed for anxiety. May take 2 tabs at night    Dispense:  30 tablet    Refill:  1    *This clinic note was created using Scientist, clinical (histocompatibility and immunogenetics). Therefore, there may be occasional mistakes despite careful proofreading.   ?    Domenick Gong, MD 04/07/20 2129

## 2020-04-07 NOTE — Discharge Instructions (Addendum)
Try increasing the Vistaril to 50 mg 4 times a day.  You may take 2 tabs of the Vistaril at night.  Follow-up with behavioral health as scheduled tomorrow, they may decide to put you on additional longer acting medications to help your anxiety

## 2020-04-07 NOTE — ED Triage Notes (Signed)
Pt states he went to the ED yesterday for anxiety. Pt states he was given LORazepam at the ED and prescribed Hydroxyzine. Pt states the medication is not working. Pt states he needs medication that will help his anxiety.

## 2020-04-21 NOTE — Progress Notes (Deleted)
Patient ID: Larry Aguirre, male   DOB: [birth year], 49 y.o.   MRN: [id number]   After ED visit on 04/07/2020 for grief reaction and poor sleep since mom died about 1 month ago.    From A/P: Patient with anxiety, with perhaps a component of depression with it.  He is grieving the recent loss of his mother.  Will increase his Vistaril to 50 mg 3 times a day, may take 100 mg at night.  Has follow-up with behavioral health tomorrow.  Discussed with him the habit-forming tendencies of benzodiazepines and that we do not regularly prescribe benzodiazepines from the urgent care.  He is amenable to this plan.

## 2020-04-27 ENCOUNTER — Other Ambulatory Visit: Payer: Self-pay

## 2020-04-27 ENCOUNTER — Ambulatory Visit: Payer: Self-pay | Attending: Physician Assistant | Admitting: Physician Assistant

## 2021-09-12 IMAGING — DX DG CHEST 1V PORT
1 series · 2 of 2 positions shown · non-contrast
Comparison: August 30, 2017

CLINICAL DATA: Cough

EXAM:
PORTABLE CHEST 1 VIEW

[Series 1: chest · 0.14mm/px · 2 of 2 slices shown]
[im 1/2]
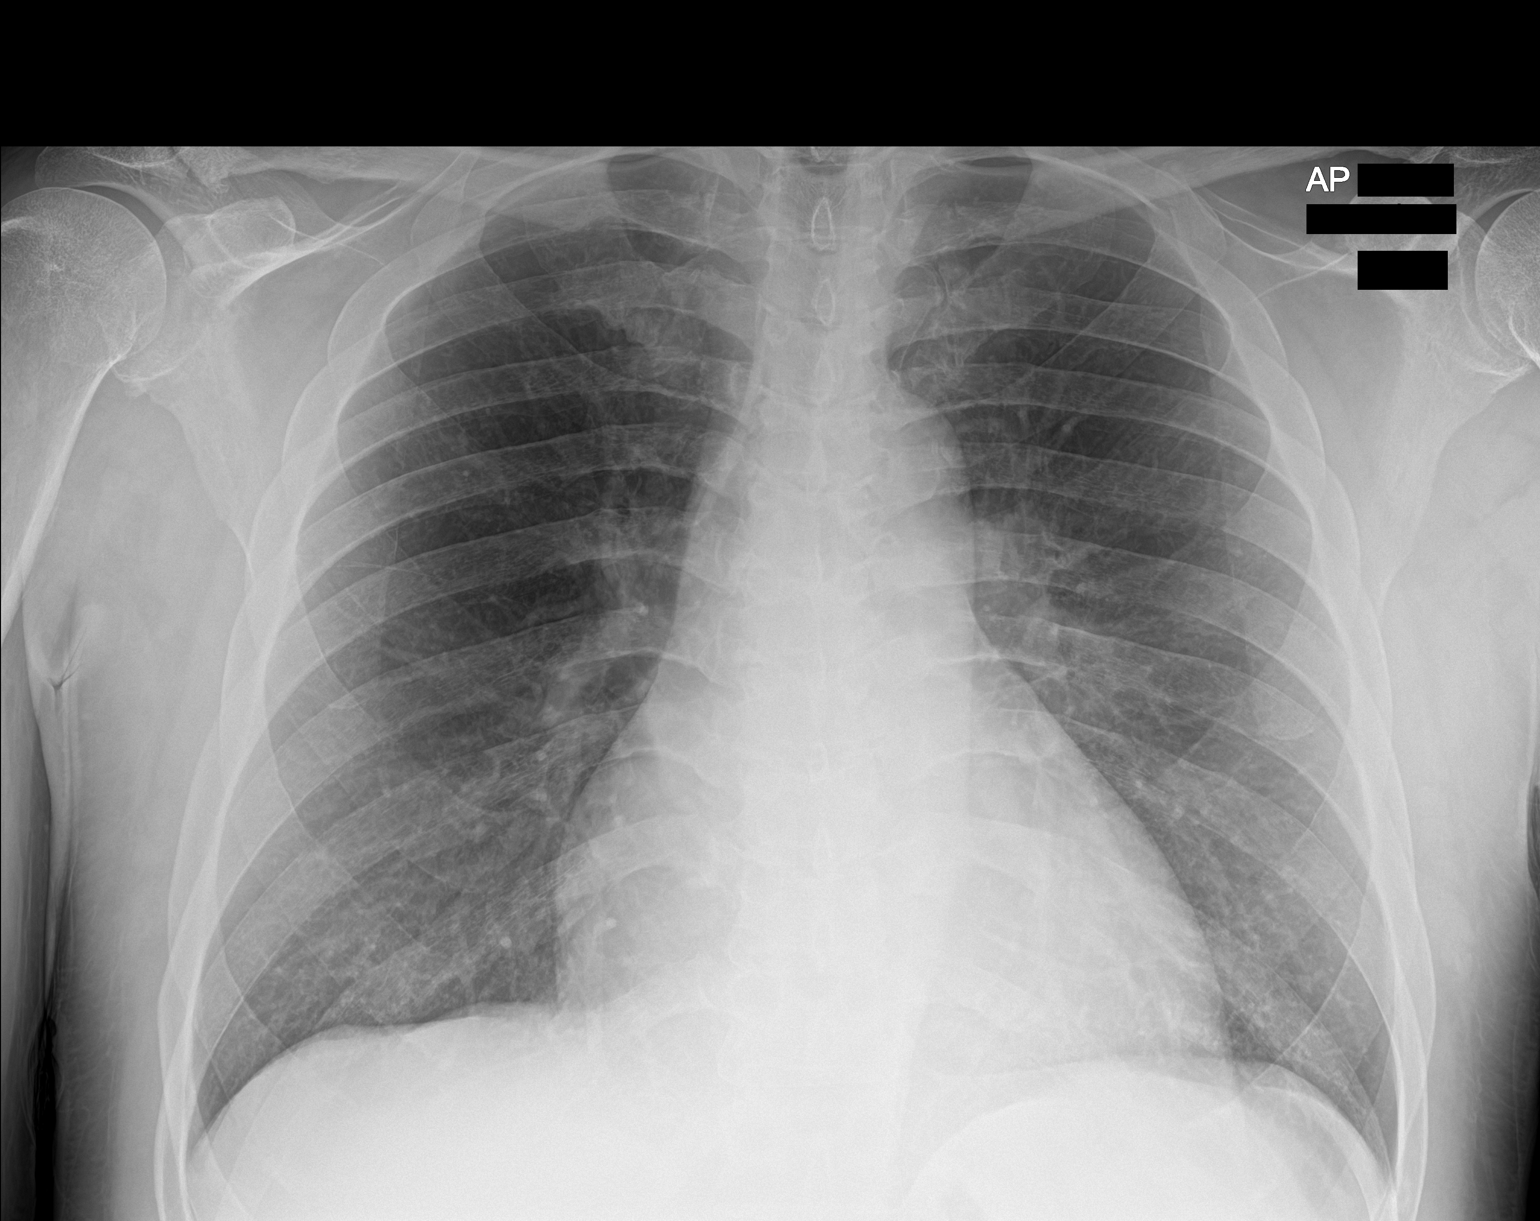
[im 2/2]
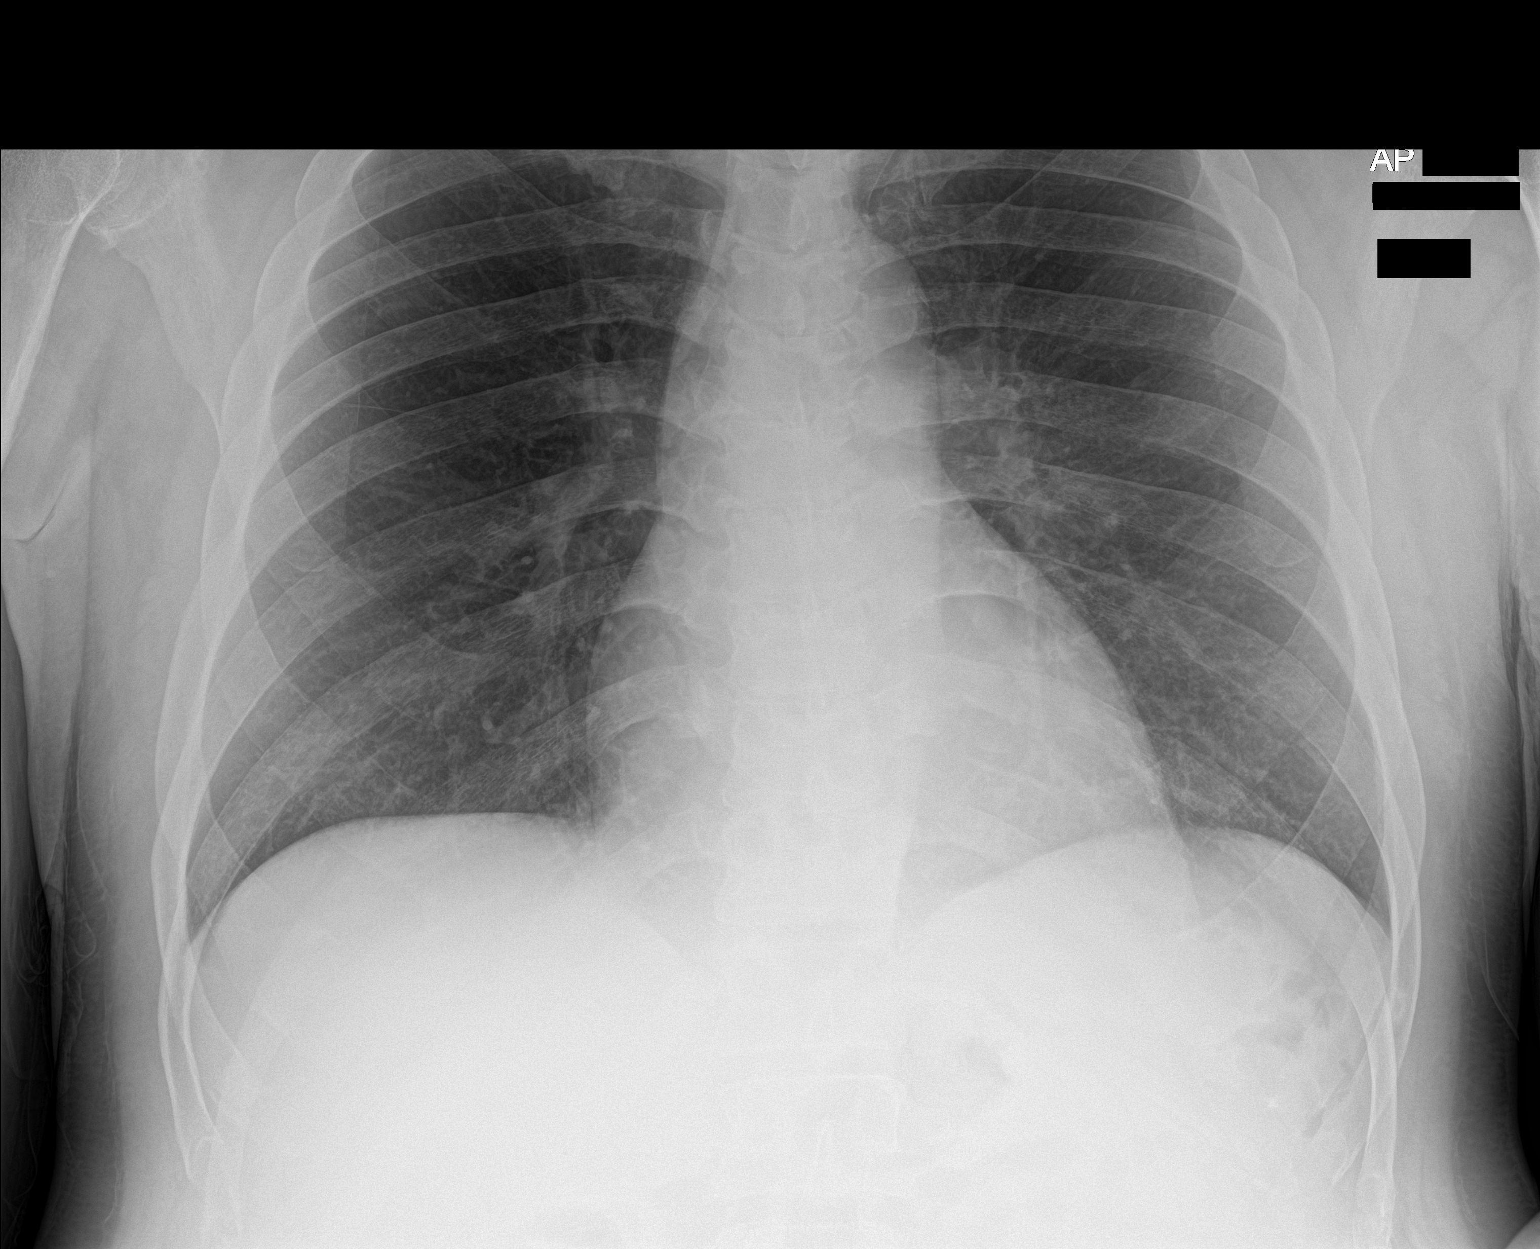

[2 of 2 positions shown; findings below may reference images not displayed]

FINDINGS: The heart size is mildly enlarged. There is mild vascular congestion
without overt pulmonary edema. There is no pneumothorax or pleural
effusion. There is no focal infiltrate.
IMPRESSION: Mild cardiomegaly and vascular congestion without overt pulmonary
edema.

## 2024-03-08 ENCOUNTER — Encounter (HOSPITAL_COMMUNITY): Payer: Self-pay

## 2024-03-08 ENCOUNTER — Ambulatory Visit (HOSPITAL_COMMUNITY)
Admission: EM | Admit: 2024-03-08 | Discharge: 2024-03-08 | Disposition: A | Discharge status: Home / Self Care | Payer: Self-pay | Attending: Family Medicine | Admitting: Family Medicine

## 2024-03-08 DIAGNOSIS — Z202 Contact with and (suspected) exposure to infections with a predominantly sexual mode of transmission: Secondary | ICD-10-CM | POA: Insufficient documentation

## 2024-03-08 DIAGNOSIS — N342 Other urethritis: Secondary | ICD-10-CM | POA: Insufficient documentation

## 2024-03-08 MED ORDER — METRONIDAZOLE 500 MG PO TABS: 2000.0000 mg | ORAL_TABLET | Freq: Once | ORAL | 0 refills | Status: AC

## 2024-03-08 MED ORDER — CEFTRIAXONE SODIUM 500 MG IJ SOLR
500.0000 mg | INTRAMUSCULAR | Status: DC
  Administered 2024-03-08: 500 mg via INTRAMUSCULAR

## 2024-03-08 MED ORDER — LIDOCAINE HCL (PF) 1 % IJ SOLN
INTRAMUSCULAR | Status: AC
  Filled 2024-03-08: qty 2

## 2024-03-08 MED ORDER — DOXYCYCLINE HYCLATE 100 MG PO CAPS: 100.0000 mg | ORAL_CAPSULE | Freq: Two times a day (BID) | ORAL | 0 refills | Status: AC

## 2024-03-08 MED ORDER — CEFTRIAXONE SODIUM 500 MG IJ SOLR
INTRAMUSCULAR | Status: AC
  Filled 2024-03-08: qty 500

## 2024-03-08 NOTE — Discharge Instructions (Signed)
 You have been given a shot of ceftriaxone  500 mg  Take doxycycline 100 mg --1 capsule 2 times daily for 7 days  Take metronidazole  500 mg-- 4 tablets by mouth once.  Take this medication after you have had a good sized meal.  Avoid drinking alcohol within 72 hours of taking this medication

## 2024-03-08 NOTE — ED Triage Notes (Signed)
 Patient presents with STD- with blood work. Patient states he was expose to [facility] and Trich.

## 2024-03-08 NOTE — ED Provider Notes (Signed)
 [facility]    CSN: [id number] Arrival date & time: 03/08/24  9061      History   Chief Complaint Chief Complaint  Patient presents with   Exposure to STD    HPI Larry Aguirre is a 53 y.o. male.    Exposure to STD   Here for dysuria, penile dc and itching.  The symptoms have been going on for few days at least.  A sexual partner has told him that she has tested positive for 3 things, and he is pretty sure this is gonorrhea, chlamydia, and trichomonas.  No fever or vomiting or abdominal pain.  He is allergic to sulfa  Past Medical History:  Diagnosis Date   H. pylori infection    Hypospadias    Obesity (BMI 30-39.9)    Syphilis    UTI (lower urinary tract infection)     There are no active problems to display for this patient.   Past Surgical History:  Procedure Laterality Date   HERNIA REPAIR         Home Medications    Prior to Admission medications   Medication Sig Start Date End Date Taking? Authorizing Provider  doxycycline (VIBRAMYCIN) 100 MG capsule Take 1 capsule (100 mg total) by mouth 2 (two) times daily for 7 days. 03/08/24 03/15/24 Yes Heavenleigh Petruzzi, Sharlet POUR, MD  metroNIDAZOLE  (FLAGYL ) 500 MG tablet Take 4 tablets (2,000 mg total) by mouth once for 1 dose. 03/08/24 03/08/24 Yes Vonna Sharlet POUR, MD  hydrOXYzine  (ATARAX /VISTARIL ) 50 MG tablet Take 1 tablet (50 mg total) by mouth every 6 (six) hours as needed for anxiety. May take 2 tabs at night 04/07/20   Mortenson, Ashley, MD  famotidine  (PEPCID ) 20 MG tablet Take 1 tablet (20 mg total) by mouth 2 (two) times daily. Patient not taking: Reported on 10/04/2019 05/23/19 10/04/19  Darr, Jacob, PA-C  omeprazole  (PRILOSEC) 20 MG capsule Take 1 capsule (20 mg total) by mouth daily. 12/09/18 03/04/19  Wieters, Hallie C, PA-C  ranitidine  (ZANTAC ) 300 MG tablet Take 0.5 tablets (150 mg total) by mouth at bedtime. 12/12/17 12/09/18  Martell Grate, PA-C    Family History Family History  Problem  Relation Age of Onset   Diabetes Mother    Diabetes Father     Social History Social History   Tobacco Use   Smoking status: Former   Smokeless tobacco: Never  Substance Use Topics   Alcohol use: Not Currently   Drug use: No     Allergies   Septra [sulfamethoxazole-trimethoprim]   Review of Systems Review of Systems   Physical Exam Triage Vital Signs ED Triage Vitals [03/08/24 1050]  Encounter Vitals Group     BP (!) 162/103     Girls Systolic BP Percentile      Girls Diastolic BP Percentile      Boys Systolic BP Percentile      Boys Diastolic BP Percentile      Pulse Rate 65     Resp 18     Temp 98.7 F (37.1 C)     Temp Source Oral     SpO2 98 %     Weight      Height      Head Circumference      Peak Flow      Pain Score      Pain Loc      Pain Education      Exclude from Growth Chart    No data found.  Updated  Vital Signs BP (!) 162/103 (BP Location: Left Arm)   Pulse 65   Temp 98.7 F (37.1 C) (Oral)   Resp 18   SpO2 98%   Visual Acuity Right Eye Distance:   Left Eye Distance:   Bilateral Distance:    Right Eye Near:   Left Eye Near:    Bilateral Near:     Physical Exam Vitals reviewed.  Constitutional:      General: He is not in acute distress.    Appearance: He is not toxic-appearing.  Skin:    Coloration: Skin is not pale.  Neurological:     Mental Status: He is alert and oriented to person, place, and time.  Psychiatric:        Behavior: Behavior normal.      UC Treatments / Results  Labs (all labs ordered are listed, but only abnormal results are displayed) Labs Reviewed  RPR  HIV ANTIBODY (ROUTINE TESTING W REFLEX)  CYTOLOGY, (ORAL, ANAL, URETHRAL) ANCILLARY ONLY    EKG   Radiology No results found.  Procedures Procedures (including critical care time)  Medications Ordered in UC Medications  cefTRIAXone  (ROCEPHIN ) injection 500 mg (has no administration in time range)    Initial Impression /  Assessment and Plan / UC Course  I have reviewed the triage vital signs and the nursing notes.  Pertinent labs & imaging results that were available during my care of the patient were reviewed by me and considered in my medical decision making (see chart for details).     Blood is drawn for HIV and RPR, and staff will notify them if any of that is positive  Urethral self swab is done and staff will notify him of any positives and treat per protocol.  Empiric treatment is provided with a Rocephin  injection here and doxycycline and Flagyl  are sent to the pharmacy to treat for the infections he was exposed to, since he is symptomatic at this time. Final Clinical Impressions(s) / UC Diagnoses   Final diagnoses:  Urethritis  STD exposure     Discharge Instructions      You have been given a shot of ceftriaxone  500 mg  Take doxycycline 100 mg --1 capsule 2 times daily for 7 days  Take metronidazole  500 mg-- 4 tablets by mouth once.  Take this medication after you have had a good sized meal.  Avoid drinking alcohol within 72 hours of taking this medication     ED Prescriptions     Medication Sig Dispense Auth. Provider   doxycycline (VIBRAMYCIN) 100 MG capsule Take 1 capsule (100 mg total) by mouth 2 (two) times daily for 7 days. 14 capsule Varvara Legault K, MD   metroNIDAZOLE  (FLAGYL ) 500 MG tablet Take 4 tablets (2,000 mg total) by mouth once for 1 dose. 4 tablet Aman Bonet, Sharlet POUR, MD      PDMP not reviewed this encounter.   Vonna Sharlet POUR, MD 03/08/24 1124

## 2024-03-09 LAB — RPR
RPR Ser Ql: REACTIVE — AB
RPR Titer: 1:4 {titer}

## 2024-03-10 ENCOUNTER — Ambulatory Visit (HOSPITAL_COMMUNITY): Payer: Self-pay

## 2024-03-10 LAB — CYTOLOGY, (ORAL, ANAL, URETHRAL) ANCILLARY ONLY
Chlamydia: POSITIVE — AB
Comment: NEGATIVE
Comment: NEGATIVE
Comment: NORMAL
Neisseria Gonorrhea: POSITIVE — AB
Trichomonas: POSITIVE — AB

## 2024-03-10 LAB — T.PALLIDUM AB, TOTAL: T Pallidum Abs: REACTIVE — AB

## 2024-03-11 LAB — MISC LABCORP TEST (SEND OUT): Labcorp test code: 83935
